# Patient Record
Sex: Female | Born: 1935 | Race: White | Hispanic: No | Smoking: Never smoker
Health system: Southern US, Community
[De-identification: ages and names within clinical notes are randomized; demographics above are authoritative.]

## PROBLEM LIST (undated history)

## (undated) DIAGNOSIS — IMO0001 Reserved for inherently not codable concepts without codable children: Secondary | ICD-10-CM

## (undated) DIAGNOSIS — H269 Unspecified cataract: Secondary | ICD-10-CM

## (undated) DIAGNOSIS — I10 Essential (primary) hypertension: Secondary | ICD-10-CM

## (undated) DIAGNOSIS — F0280 Dementia in other diseases classified elsewhere without behavioral disturbance: Secondary | ICD-10-CM

## (undated) DIAGNOSIS — F0390 Unspecified dementia without behavioral disturbance: Secondary | ICD-10-CM

## (undated) DIAGNOSIS — G309 Alzheimer's disease, unspecified: Secondary | ICD-10-CM

## (undated) DIAGNOSIS — H9190 Unspecified hearing loss, unspecified ear: Secondary | ICD-10-CM

## (undated) HISTORY — PX: CATARACT EXTRACTION W/ INTRAOCULAR LENS IMPLANT: SHX1309

---

## 2008-08-04 ENCOUNTER — Ambulatory Visit: Payer: Self-pay | Admitting: Diagnostic Radiology

## 2008-08-04 ENCOUNTER — Encounter: Payer: Self-pay | Admitting: Emergency Medicine

## 2008-08-05 ENCOUNTER — Inpatient Hospital Stay (HOSPITAL_COMMUNITY): Admission: EM | Admit: 2008-08-05 | Discharge: 2008-08-08 | Payer: Self-pay | Admitting: *Deleted

## 2009-12-18 ENCOUNTER — Emergency Department (HOSPITAL_BASED_OUTPATIENT_CLINIC_OR_DEPARTMENT_OTHER): Admission: EM | Admit: 2009-12-18 | Discharge: 2009-12-18 | Payer: Self-pay | Admitting: Emergency Medicine

## 2009-12-18 ENCOUNTER — Ambulatory Visit: Payer: Self-pay | Admitting: Diagnostic Radiology

## 2010-09-09 LAB — BASIC METABOLIC PANEL
BUN: 8 mg/dL (ref 6–23)
CO2: 26 mEq/L (ref 19–32)
CO2: 29 mEq/L (ref 19–32)
Calcium: 9 mg/dL (ref 8.4–10.5)
Chloride: 110 mEq/L (ref 96–112)
Chloride: 111 mEq/L (ref 96–112)
Creatinine, Ser: 1 mg/dL (ref 0.4–1.2)
Creatinine, Ser: 1.1 mg/dL (ref 0.4–1.2)
GFR calc Af Amer: 56 mL/min — ABNORMAL LOW (ref >60)
GFR calc Af Amer: >60 mL/min (ref >60)
GFR calc non Af Amer: 52 mL/min — ABNORMAL LOW (ref >60)
Glucose, Bld: 93 mg/dL (ref 70–99)
Potassium: 3.3 mEq/L — ABNORMAL LOW (ref 3.5–5.1)
Potassium: 3.4 mEq/L — ABNORMAL LOW (ref 3.5–5.1)
Sodium: 141 mEq/L (ref 135–145)

## 2010-09-09 LAB — URINALYSIS, ROUTINE W REFLEX MICROSCOPIC
Bilirubin Urine: NEGATIVE
Glucose, UA: NEGATIVE mg/dL
Leukocytes, UA: NEGATIVE
Leukocytes, UA: NEGATIVE
Nitrite: NEGATIVE
Protein, ur: NEGATIVE mg/dL
Protein, ur: NEGATIVE mg/dL
Specific Gravity, Urine: 1.015 (ref 1.005–1.030)
Urobilinogen, UA: 1 mg/dL (ref 0.0–1.0)
Urobilinogen, UA: 1 mg/dL (ref 0.0–1.0)
pH: 8 (ref 5.0–8.0)

## 2010-09-09 LAB — CULTURE, BLOOD (ROUTINE X 2)

## 2010-09-09 LAB — CBC
HCT: 33.6 % — ABNORMAL LOW (ref 36.0–46.0)
HCT: 33.9 % — ABNORMAL LOW (ref 36.0–46.0)
Hemoglobin: 11.4 g/dL — ABNORMAL LOW (ref 12.0–15.0)
Hemoglobin: 11.5 g/dL — ABNORMAL LOW (ref 12.0–15.0)
MCHC: 33.9 g/dL (ref 30.0–36.0)
Platelets: 238 10*3/uL (ref 150–400)
RBC: 3.89 MIL/uL (ref 3.87–5.11)
RDW: 13 % (ref 11.5–15.5)
WBC: 7.9 10*3/uL (ref 4.0–10.5)

## 2010-09-09 LAB — DIFFERENTIAL
Basophils Absolute: 0 10*3/uL (ref 0.0–0.1)
Basophils Absolute: 0.1 10*3/uL (ref 0.0–0.1)
Basophils Relative: 0 % (ref 0–1)
Eosinophils Absolute: 0 10*3/uL (ref 0.0–0.7)
Eosinophils Relative: 0 % (ref 0–5)
Lymphs Abs: 0.7 10*3/uL (ref 0.7–4.0)
Monocytes Absolute: 0.8 10*3/uL (ref 0.1–1.0)
Monocytes Relative: 8 % (ref 3–12)

## 2010-09-09 LAB — CULTURE, RESPIRATORY W GRAM STAIN

## 2010-09-09 LAB — PHOSPHORUS: Phosphorus: 3.5 mg/dL (ref 2.3–4.6)

## 2010-09-09 LAB — COMPREHENSIVE METABOLIC PANEL
Albumin: 3.8 g/dL (ref 3.5–5.2)
Alkaline Phosphatase: 66 U/L (ref 39–117)
Calcium: 9.4 mg/dL (ref 8.4–10.5)
Chloride: 104 mEq/L (ref 96–112)
GFR calc non Af Amer: 52 mL/min — ABNORMAL LOW (ref >60)
Glucose, Bld: 114 mg/dL — ABNORMAL HIGH (ref 70–99)
Potassium: 4.1 mEq/L (ref 3.5–5.1)

## 2010-09-09 LAB — MAGNESIUM: Magnesium: 2.2 mg/dL (ref 1.5–2.5)

## 2010-09-09 LAB — BRAIN NATRIURETIC PEPTIDE: Pro B Natriuretic peptide (BNP): 72.1 pg/mL (ref 0.0–100.0)

## 2010-09-09 LAB — URINE MICROSCOPIC-ADD ON

## 2010-09-09 LAB — EXPECTORATED SPUTUM ASSESSMENT W GRAM STAIN, RFLX TO RESP C

## 2010-09-09 LAB — PROTIME-INR: Prothrombin Time: 16 seconds — ABNORMAL HIGH (ref 11.6–15.2)

## 2010-10-12 NOTE — Discharge Summary (Signed)
NAMESONJI, Sophia Montgomery             ACCOUNT NO.:  [id number]   MEDICAL RECORD NO.:  [id number]          PATIENT TYPE:  INP   LOCATION:  1413                         FACILITY:  [facility]   PHYSICIAN:  Hillery Aldo, M.D.   DATE OF BIRTH:  12-22-1914   DATE OF ADMISSION:  08/05/2008  DATE OF DISCHARGE:  08/08/2008                               DISCHARGE SUMMARY   PRIMARY CARE PHYSICIAN:  Sophia Montgomery, M.D. in [place].   DISCHARGE DIAGNOSES:  1. Bibasilar pneumonia.  2. Gastroesophageal reflux disease with possible esophageal      dysmotility.  3. Dementia.  4. Hypertension.  5. Hypokalemia.  6. Mild normocytic anemia.  7. Chronic thoracic compression fractures.  8. Acute sinusitis.  9. Stage 3 chronic kidney disease.   DISCHARGE MEDICATIONS:  1. Aricept 5 mg p.o. daily.  2. Aspirin 81 mg p.o. daily.  3. Docusate 100 mg p.o. daily.  4. Furosemide 20 mg p.o. daily.  5. MiraLax 17 g p.o. daily.  6. Lotemax 0.5% one drop in each eye b.i.d.  7. Refresh eye drops 1 drop in each eye b.i.d.  8. GenTeal lubricant 0.3% one drop in each eye q.i.d.  9. Diazepam 5 mg p.o. bedtime.  10.GenTeal Severe Sterile 0.3% to both eyes at bedtime.  11.Lacri-Lube SOP ointment to both eyes bedtime.  12.Hydrocodone with acetaminophen 5/500 one tablet q.6 h p.r.n. pain.  13.Ultracet 37.5/325 one tablet t.i.d. p.r.n. pain.  14.Milk of magnesia 400 mg per 5 mL daily p.r.n. constipation, take 30      mL by mouth.  15.Hycodan syrup 5 mL q.6 h p.r.n. cough.  16.Norvasc 10 mg p.o. daily.  17.Azithromycin 500 mg p.o. daily x2 more days.  18.Avelox 400 mg daily x4 more days.  19.Potassium chloride 10 mEq p.o. b.i.d.   CONSULTATIONS:  None.   BRIEF ADMISSION HISTORY OF PRESENT ILLNESS:  The patient is a 76-year-  old female who sustained a fall in her skilled nursing facility and was  found on the floor with a fever of 103.1.  She subsequently was brought  to the hospital for further  evaluation and treatment.  For full details,  please see the dictated report done by Dr. Allena Katz.   PROCEDURES AND DIAGNOSTIC STUDIES:  1. Chest x-ray on August 04, 2008 showed bibasilar infiltrates and      thoracic compression fractures.  2. CT scan of the head on August 04, 2008 showed no acute traumatic      injury.  Advanced ischemic sequelae in the brain.  No acute      intracranial abnormality.  Diffuse paranasal sinus inflammatory      changes suggestive of acute sinusitis.  3. Chest x-ray on July 08, 2008 showed improved bibasilar      pneumonia.  Small bilateral effusions have increased.  4. Swallowing evaluation on August 06, 2008 showed no evidence of      aspiration or penetration with any consistency.  Mild vallecular      residual with pudding was cleared with liquid consumption.      Possible esophageal stasis and backflow of thin liquids with  possible esophageal dysmotility.  Consider outpatient esophagram      for empiric treatment.   DISCHARGE LABORATORY VALUES:  Respiratory cultures grew normal oral  pharyngeal flora.  BNP was 72.1.  Sodium was 143, potassium 3.5,  chloride 108, bicarb 29, BUN 8, creatinine 1.10, glucose 96.  White  blood cell count was 7.9, hemoglobin 12.3, hematocrit 36.3, platelets  239.   HOSPITAL COURSE BY PROBLEM:  1. Weakness secondary to bibasilar pneumonia:  The patient was      admitted and sputum cultures were obtained which did not grow any      pathogenic bacteria.  The patient is currently on day 4 of      antibiotics, currently Avelox and azithromycin.  She will complete      a total course of 5 days of therapy with azithromycin and 7 days of      therapy with Avelox.  Because of concerns for aspiration,      swallowing evaluation was performed which did not show any evidence      of aspiration.  She was put on reflux precautions, given the      possible diagnosis of esophageal dysmotility.  If concerns persist,      an  esophagram can be performed as an outpatient.  2. Gastroesophageal reflux disease:  Again, the patient possibly has      esophageal dysmotility.  Outpatient esophagram can be performed if      deemed necessary by her primary care physician.  3. Dementia:  The patient was maintained on Aricept.  4. Hypertension:  The patient was started on Norvasc and this was      titrated up to 10 mg.  She should have outpatient followup of her      blood pressure to ensure that she is controlled..  5. Hypokalemia:  The patient was repleted and put on routine      supplementation, given that she is on Lasix regularly.  6. Mild normocytic anemia:  The patient's anemia resolved at      discharge.  7. Chronic thoracic compression fractures:  No acute treatment      indicated.  The patient will likely need to be considered for      osteoporosis therapy as an outpatient.  With her esophageal      dysmotility, she likely is not a bisphosphonate candidate but may      benefit from calcitonin.  She should have an outpatient bone      density scan done if not done already, and consideration for      initiation of empiric therapy with calcitonin and calcium      supplements.  8. Stage 3 chronic kidney disease:  The patient's creatinine clearance      is consistent with a diagnosis of stage 3 chronic kidney disease.      Given this, we are controlling her blood pressure to help prevent      any further deterioration in her renal function.   DISPOSITION:  The patient is medically stable and can be returned to  [facility].  She should receive PT and OT  services while there until she is at her baseline level of functioning.  She should follow up with primary care physician next week.  She should  be maintained on a dysphagia 2 with thin liquid diet.   Time spent coordinating care for discharge and discharge instructions  equals 35 minutes.      Hillery Aldo, M.D.  Electronically  Signed     CR/MEDQ  D:  08/08/2008  T:  08/08/2008  Job:  [id number]   cc:   Sophia Montgomery  Fax: [phone]

## 2010-10-12 NOTE — H&P (Signed)
Sophia Montgomery, Sophia Montgomery             ACCOUNT NO.:  [id number]   MEDICAL RECORD NO.:  [id number]          PATIENT TYPE:  INP   LOCATION:  1413                         FACILITY:  [facility] [place]   PHYSICIAN:  Manus Gunning, MD      DATE OF BIRTH:  [birth year]   DATE OF ADMISSION:  08/05/2008  DATE OF DISCHARGE:                              HISTORY & PHYSICAL   CHIEF COMPLAINT:  Sustained fall, unwitnessed with chronic cough and  pneumonia on chest x-ray.   HISTORY OF PRESENT ILLNESS:  Sophia Montgomery is a 77 year old Caucasian  female resident of a skilled nursing facility.  Apparently she was found  on the floor in her room and was brought to [facility]. At  the time of presentation, she had a temperature of 103.1.  This prompted  work up for an infective process including a UA, which was negative, a  chest x-ray which demonstrated bibasilar infiltrates.  She has had a  cough at the skilled nursing facility, the duration of which is unknown,  secondary to the fact that the patient has moderate dementia and is very  hard of hearing and can provide very little to no history at all.  According to the ED sign out, over the phone, the patient had an  unwitnessed fall and was just found lying on the floor.  A CT scan of  the head did not demonstrate any acute intracranial abnormality.  The  patient, herself, upon interview when she presented to our facility  claims that she does not have a cough, though we can actively hear  rhonchi upon entering the room.   PAST MEDICAL HISTORY:  1. Dementia.  2. Syncope.  3. Rhabdomyolysis.   PAST SURGICAL HISTORY:  Unknown.   SOCIAL HISTORY:  Nonsmoker, nondrinker, no drug abuse.  Lives at a  skilled nursing facility.   HOME MEDICATIONS:  1. Aricept 5 mg p.o. daily.  2. Aspirin 81 mg daily.  3. Docusate 100 mg daily.  4. Furosemide 20 mg daily.  5. Spironolactone 2 grams powder daily.  6. Lotemax 0.5%, one drop both eyes b.i.d.  7. Refresh 1  drop both eyes b.i.d.  8. GenTeal lubricant 0.3%, one drop q.i.d.  9. Diazepam 5 mg nightly.  10.GenTeal Severe Sterile 0.3% to eyes q.h.s.  11.Lacri-Lube S.O.P. ointment GM to eyes q.h.s.  12.Hycodan syrup 5 mL every 6 hours as needed p.r.n.  13.Hydrocodone with acetaminophen 5/500 every 6 hours as needed.  14.Milk of Magnesia 400 mg per 5 mL, take 30 mL by mouth daily for      constipation p.r.n.  15.Tramadol HCl/Acetaminophen (Ultracet), take every 4 hours as needed      for shoulder pain.   FAMILY HISTORY:  Unable to be obtained.   ALLERGIES:  No known drug allergies.   REVIEW OF SYSTEMS:  Essentially a 14 point review of systems has been  performed, unfortunately unable to obtain majority, essentially none at  all secondary to the patient being hard of hearing and with moderate  dementia.   PHYSICAL EXAMINATION:  VITAL SIGNS:  At the time of  presentation as  follows: Temperature 103.1, pulse 108, respiratory rate 20, blood  pressure 174/86, improved to 124/56.  Oxygen saturation 91% on room air.  GENERAL:  Frail, elderly lady lying in bed, comfortably sleeping, no  apparent distress.  HEENT:  Normocephalic, atraumatic.  Dry oral mucosa.  No postnasal drip.  Eyes anicteric.  Extraocular muscles intact.  Pupils equal and react to  light and accommodation.  NECK:  Supple.  Good range of motion.  No thyromegaly.  Neck veins flat.  CARDIOVASCULAR:  S1, S2 normal.  Regular rate and rhythm.  No murmurs,  rubs or gallops.  RESPIRATORY:  Coarse breath sounds diffusely across the chest along the  upper respiratory tract with rhonchi in upper respiratory tract and  lower, air entry appears to be bilaterally equal.  ABDOMEN:  Soft, nontender, nondistended.  Positive bowel sounds.  No  organomegaly.  EXTREMITIES:  No cyanosis, no clubbing, no edema, positive bilateral  dorsalis pedis pulses.  CNS:  Patient is asleep, able to be aroused. Patient does not know where  she is and is  confused about the year as well though she does attempt to  answer questions, is very hard of hearing.  Moves all four extremities  independently.  Cranial nerves II-XII grossly intact.  SKIN:  No rashes, ulcerations, no masses, no edema, no palpable  lymphadenopathy.  No bruising or petechiae appreciated.   LABORATORY DATA:  Urinalysis negative nitrate, negative leukocyte  esterase, epithelial cells rare.  Urine WBCs and RBCs 0-2 each.  Bacteria rare.  WBC count 13,500, hemoglobin 12.9, hematocrit 38.1,  platelet count 238,000.  Polymorphs 88%.  Sodium 141, potassium 4.1,  chloride 104, carbon dioxide 29, glucose 114, BUN 20, creatinine 1.0.  Total protein 7.3, albumin 3.8, AST 34, ALT 16.  Chest x-ray  demonstrates bibasilar infiltrates with thoracic compression fractures.  CT of the head without contrast demonstrating calcified atherosclerosis  with sinusitis with cortical and subcortical encephalomalacia diffusely.  No evidence of acute cortically based infarct identified.   ASSESSMENT AND PLAN:  1. Pneumonia.  Start normal saline 125 mL an hour.  Repeat chest x-ray      PA and lateral in the morning.  Obtain sputum culture, sensitivity.      Also obtain blood cultures x2.  The patient is a resident of a      skilled nursing facility, start on vancomycin and Zosyn at this      time.  Start O2 by nasal cannula and keep oxygen saturation greater      than 92%.  2. Dementia.  Continue all home medications and monitor and treat      supportively.  3. Gastrointestinal and deep venous thrombosis prophylaxis, Protonix      40 mg p.o. daily with systemic compressive devices.      Manus Gunning, MD  Electronically Signed     SP/MEDQ  D:  08/05/2008  T:  08/05/2008  Job:  [id number]

## 2011-02-13 ENCOUNTER — Encounter: Payer: Self-pay | Admitting: Emergency Medicine

## 2011-02-13 ENCOUNTER — Emergency Department (HOSPITAL_BASED_OUTPATIENT_CLINIC_OR_DEPARTMENT_OTHER)
Admission: EM | Admit: 2011-02-13 | Discharge: 2011-02-13 | Disposition: A | Discharge status: Home / Self Care | Payer: Medicare Other | Attending: Emergency Medicine | Admitting: Emergency Medicine

## 2011-02-13 DIAGNOSIS — R609 Edema, unspecified: Secondary | ICD-10-CM | POA: Insufficient documentation

## 2011-02-13 DIAGNOSIS — I10 Essential (primary) hypertension: Secondary | ICD-10-CM | POA: Insufficient documentation

## 2011-02-13 DIAGNOSIS — Z79899 Other long term (current) drug therapy: Secondary | ICD-10-CM | POA: Insufficient documentation

## 2011-02-13 DIAGNOSIS — R600 Localized edema: Secondary | ICD-10-CM

## 2011-02-13 DIAGNOSIS — G309 Alzheimer's disease, unspecified: Secondary | ICD-10-CM | POA: Insufficient documentation

## 2011-02-13 DIAGNOSIS — F0280 Dementia in other diseases classified elsewhere without behavioral disturbance: Secondary | ICD-10-CM | POA: Insufficient documentation

## 2011-02-13 HISTORY — DX: Unspecified dementia, unspecified severity, without behavioral disturbance, psychotic disturbance, mood disturbance, and anxiety: F03.90

## 2011-02-13 HISTORY — DX: Essential (primary) hypertension: I10

## 2011-02-13 HISTORY — DX: Alzheimer's disease, unspecified: G30.9

## 2011-02-13 HISTORY — DX: Unspecified hearing loss, unspecified ear: H91.90

## 2011-02-13 HISTORY — DX: Unspecified cataract: H26.9

## 2011-02-13 HISTORY — DX: Reserved for inherently not codable concepts without codable children: IMO0001

## 2011-02-13 HISTORY — DX: Dementia in other diseases classified elsewhere, unspecified severity, without behavioral disturbance, psychotic disturbance, mood disturbance, and anxiety: F02.80

## 2011-02-13 NOTE — ED Notes (Signed)
Pt presented with swelling to L ankle pulses intact non pitting edema

## 2011-02-13 NOTE — ED Provider Notes (Signed)
History     CSN: [id number] Arrival date & time: 02/13/2011  4:51 PM   Chief Complaint  Patient presents with  . Joint Swelling    L ankle swelling per care home     (Include location/radiation/quality/duration/timing/severity/associated sxs/prior treatment) HPI Comments: Nursing home reports that they noticed some redness and swelling to the left leg that was noticed by the staff today:staff denies falls  The history is provided by the nursing home and the EMS personnel. History Limited By: pt has alzheimers. No language interpreter was used.     Past Medical History  Diagnosis Date  . Dementia   . Hypertension   . Cataracts, both eyes   . Alzheimer's dementia   . Impaired hearing      Past Surgical History  Procedure Date  . Cataract extraction w/ intraocular lens implant     No family history on file.  History  Substance Use Topics  . Smoking status: Never Smoker   . Smokeless tobacco: Not on file  . Alcohol Use: No    OB History    Grav Para Term Preterm Abortions TAB SAB Ect Mult Living                  Review of Systems  Unable to perform ROS: Dementia  Respiratory: Negative.   Skin:       C/o redness to the lower leg/ankle    Allergies  Review of patient's allergies indicates no known allergies.  Home Medications   Current Outpatient Rx  Name Route Sig Dispense Refill  . AMLODIPINE BESYLATE 10 MG PO TABS Oral Take 10 mg by mouth daily.      Marland Kitchen GENTEAL OP GEL Both Eyes Place 1 drop into both eyes daily.      Marland Kitchen LACRI-LUBE S.O.P. OP Both Eyes Place into both eyes at bedtime.      . ASPIRIN EC 81 MG PO TBEC Oral Take 81 mg by mouth daily.      Marland Kitchen DIAZEPAM 5 MG PO TABS Oral Take 5 mg by mouth at bedtime.      Marland Kitchen DOCUSATE SODIUM 50 MG/5ML PO LIQD Oral Take 100 mg by mouth daily.      . DONEPEZIL HCL 5 MG PO TABS Oral Take 5 mg by mouth at bedtime.      . FUROSEMIDE 20 MG PO TABS Oral Take 20 mg by mouth daily.      Marland Kitchen LOTEPREDNOL ETABONATE 0.5 % OP  SUSP Both Eyes Place 1 drop into both eyes daily.      Marland Kitchen POLYETHYLENE GLYCOL 3350 PO POWD Oral Take 17 g by mouth daily.      Marland Kitchen POTASSIUM CHLORIDE 10 MEQ PO TBCR Oral Take 10 mEq by mouth 2 (two) times daily.      . ACETAMINOPHEN 325 MG PO TABS Oral Take 325 mg by mouth every 4 (four) hours as needed. pain     . GENTEAL OP GEL Both Eyes Place 1 drop into both eyes 2 (two) times daily as needed. Dry eyes     . DEXTROMETHORPHAN POLISTIREX 30 MG/5ML PO LQCR Oral Take 30 mg by mouth every 12 (twelve) hours as needed. For cough     . MAGNESIUM HYDROXIDE 400 MG/5ML PO SUSP Oral Take 30 mLs by mouth daily as needed. Constipation       Physical Exam    BP 145/75  Pulse 77  Temp(Src) 98 F (36.7 C) (Oral)  Resp 18  SpO2 97%  Physical  Exam  Nursing note and vitals reviewed. Constitutional: She appears well-developed and well-nourished.  Cardiovascular: Normal rate and regular rhythm.   Pulmonary/Chest: Effort normal and breath sounds normal.  Musculoskeletal:       Pt has bilateral non pitting edema to ankles  Skin:       No redness noted to leg    ED Course  Procedures   No results found.   No diagnosis found.   MDM No redness noted on exam:pt has bilateral non pitting edema, which doesn't seem like a new finding       Teressa Lower, NP 02/13/11 1853

## 2011-02-13 NOTE — ED Notes (Signed)
The patient is undress and in a gown. The bed is locked and in the lowest position. The call light is within reach. The patient is resting. Warm blankets given.

## 2011-02-13 NOTE — ED Notes (Signed)
Patient found climbing out of bed. On arrival to room, she stated she had to pee. With help from [place], EMT we got her on the bedpan and she peed and had small bowel movement. Patient cleaned and new brief placed on patient.

## 2011-02-15 NOTE — ED Provider Notes (Signed)
History/physical exam/procedure(s) were performed by non-physician practitioner and as supervising physician I was immediately available for consultation/collaboration. I have reviewed all notes and am in agreement with care and plan.   Hilario Quarry, MD 02/15/11 1228

## 2012-11-09 ENCOUNTER — Other Ambulatory Visit (HOSPITAL_COMMUNITY): Payer: Self-pay | Admitting: Geriatric Medicine

## 2012-11-09 DIAGNOSIS — R1310 Dysphagia, unspecified: Secondary | ICD-10-CM

## 2012-11-22 ENCOUNTER — Ambulatory Visit (HOSPITAL_COMMUNITY)
Admission: RE | Admit: 2012-11-22 | Discharge: 2012-11-22 | Disposition: A | Discharge status: Home / Self Care | Payer: Medicare Other | Source: Ambulatory Visit | Attending: Geriatric Medicine | Admitting: Geriatric Medicine

## 2012-11-22 DIAGNOSIS — H9190 Unspecified hearing loss, unspecified ear: Secondary | ICD-10-CM | POA: Insufficient documentation

## 2012-11-22 DIAGNOSIS — R634 Abnormal weight loss: Secondary | ICD-10-CM | POA: Insufficient documentation

## 2012-11-22 DIAGNOSIS — R1311 Dysphagia, oral phase: Secondary | ICD-10-CM | POA: Insufficient documentation

## 2012-11-22 DIAGNOSIS — R1310 Dysphagia, unspecified: Secondary | ICD-10-CM | POA: Insufficient documentation

## 2012-11-22 NOTE — Procedures (Addendum)
Objective Swallowing Evaluation: Modified Barium Swallowing Study  Patient Details  Name: Sophia Montgomery MRN: [id number] Date of Birth: 09-01-1914  Today's Date: 11/22/2012 Time: 1210-1235 SLP Time Calculation (min): 25 min  Past Medical History: No past medical history on file. Past Surgical History: No past surgical history on file. HPI:  Pt is a 79 year old resident of SNF, arriving for an OP MBS due to concern that she may be aspirating. Pt has been observed to have wet vocal quality and unexplained weight loss. She currently consume Dys 1/thin liquids. May soon upgrade to Dys 2 due to new dentures. Pt is extremely hard of hearing but follows comamnds iwth visual/gestural cues.      Assessment / Plan / Recommendation Clinical Impression  Dysphagia Diagnosis: Mild oral phase dysphagia Clinical impression: Pt presents with a mild oral phase dysphagia including prolonged lingual pumping and holding of bolus. Initiation of swallow response delayed, with liquids falling to pyriforms prior to airway protection. Despite large consecutive sips the pt did not ever penetrate or aspirate during study. Esophageal sweep appeared WFL. Pt may continue thin liquids, will defer management of diet to primary SLP. Puree or ground solids are safe.     Treatment Recommendation  Defer treatment plan to SLP at (Comment)    Diet Recommendation Dysphagia 1 (Puree);Thin liquid   Liquid Administration via: Cup;Straw Medication Administration: Crushed with puree Supervision: Patient able to self feed Compensations: Slow rate;Small sips/bites Postural Changes and/or Swallow Maneuvers: Seated upright 90 degrees    Other  Recommendations Oral Care Recommendations: Oral care BID   Follow Up Recommendations  Assisted living    Frequency and Duration        Pertinent Vitals/Pain NA    SLP Swallow Goals     General HPI: Pt is a 79 year old resident of SNF, arriving for an OP MBS due to concern that she  may be aspirating. Pt has been observed to have wet vocal quality and unexplained weight loss. She currently consume Dys 1/thin liquids. May soon upgrade to Dys 2 due to new dentures. Pt is extremely hard of hearing but follows comamnds iwth visual/gestural cues.  Type of Study: Modified Barium Swallowing Study Reason for Referral: Objectively evaluate swallowing function Diet Prior to this Study: Dysphagia 1 (puree);Thin liquids Temperature Spikes Noted: N/A Respiratory Status: Room air History of Recent Intubation: No Behavior/Cognition: Alert;Cooperative Oral Cavity - Dentition: Edentulous Oral Motor / Sensory Function: Within functional limits Self-Feeding Abilities: Able to feed self Patient Positioning: Upright in chair Baseline Vocal Quality: Clear Volitional Cough: Weak Volitional Swallow: Able to elicit Anatomy: Within functional limits (cervical lordosis) Pharyngeal Secretions: Not observed secondary MBS    Reason for Referral Objectively evaluate swallowing function   Oral Phase Oral Preparation/Oral Phase Oral Phase: Impaired Oral - Thin Oral - Thin Cup: Lingual pumping;Holding of bolus;Delayed oral transit Oral - Thin Straw: Lingual pumping;Holding of bolus;Delayed oral transit Oral - Solids Oral - Puree: Lingual pumping;Holding of bolus;Delayed oral transit Oral - Mechanical Soft: Lingual pumping;Holding of bolus;Delayed oral transit;Lingual/palatal residue Oral - Pill: Pocketing in anterior sulcus;Delayed oral transit;Holding of bolus (pt could not transit with liquids or puree)   Pharyngeal Phase Pharyngeal Phase Pharyngeal Phase: Impaired Pharyngeal - Thin Pharyngeal - Thin Cup: Delayed swallow initiation;Premature spillage to pyriform sinuses Pharyngeal - Thin Straw: Delayed swallow initiation;Premature spillage to pyriform sinuses Pharyngeal - Solids Pharyngeal - Puree: Delayed swallow initiation;Premature spillage to pyriform sinuses Pharyngeal - Mechanical  Soft: Delayed swallow initiation;Premature spillage to pyriform sinuses  Cervical Esophageal Phase    GO    Cervical Esophageal Phase Cervical Esophageal Phase: [facility]    Functional Assessment Tool Used: clinical judgement Functional Limitations: Swallowing Swallow Current Status ([id number]): At least 1 percent but less than 20 percent impaired, limited or restricted Swallow Goal Status [phone]): At least 1 percent but less than 20 percent impaired, limited or restricted Swallow Discharge Status [phone]): At least 1 percent but less than 20 percent impaired, limited or restricted   [facility], [place] CCC-SLP [phone]  Claudine Mouton 11/22/2012, 2:26 PM

## 2013-06-23 ENCOUNTER — Emergency Department (HOSPITAL_COMMUNITY): Payer: Medicare Other

## 2013-06-23 ENCOUNTER — Encounter (HOSPITAL_COMMUNITY): Payer: Self-pay | Admitting: Emergency Medicine

## 2013-06-23 ENCOUNTER — Inpatient Hospital Stay (HOSPITAL_COMMUNITY)
Admission: EM | Admit: 2013-06-23 | Discharge: 2013-06-28 | DRG: 193 | Disposition: A | Discharge status: Hospice | Payer: Medicare Other | Attending: Internal Medicine | Admitting: Internal Medicine

## 2013-06-23 DIAGNOSIS — F0280 Dementia in other diseases classified elsewhere without behavioral disturbance: Secondary | ICD-10-CM | POA: Diagnosis present

## 2013-06-23 DIAGNOSIS — J09X2 Influenza due to identified novel influenza A virus with other respiratory manifestations: Principal | ICD-10-CM | POA: Diagnosis present

## 2013-06-23 DIAGNOSIS — R7989 Other specified abnormal findings of blood chemistry: Secondary | ICD-10-CM | POA: Diagnosis present

## 2013-06-23 DIAGNOSIS — Z7982 Long term (current) use of aspirin: Secondary | ICD-10-CM

## 2013-06-23 DIAGNOSIS — E41 Nutritional marasmus: Secondary | ICD-10-CM | POA: Diagnosis present

## 2013-06-23 DIAGNOSIS — R6889 Other general symptoms and signs: Secondary | ICD-10-CM

## 2013-06-23 DIAGNOSIS — Z79899 Other long term (current) drug therapy: Secondary | ICD-10-CM

## 2013-06-23 DIAGNOSIS — Z681 Body mass index (BMI) 19 or less, adult: Secondary | ICD-10-CM

## 2013-06-23 DIAGNOSIS — R0902 Hypoxemia: Secondary | ICD-10-CM | POA: Diagnosis present

## 2013-06-23 DIAGNOSIS — Z515 Encounter for palliative care: Secondary | ICD-10-CM

## 2013-06-23 DIAGNOSIS — G309 Alzheimer's disease, unspecified: Secondary | ICD-10-CM | POA: Diagnosis present

## 2013-06-23 DIAGNOSIS — I10 Essential (primary) hypertension: Secondary | ICD-10-CM | POA: Diagnosis present

## 2013-06-23 DIAGNOSIS — N179 Acute kidney failure, unspecified: Secondary | ICD-10-CM | POA: Diagnosis present

## 2013-06-23 DIAGNOSIS — J209 Acute bronchitis, unspecified: Secondary | ICD-10-CM | POA: Diagnosis present

## 2013-06-23 DIAGNOSIS — J9600 Acute respiratory failure, unspecified whether with hypoxia or hypercapnia: Secondary | ICD-10-CM | POA: Diagnosis present

## 2013-06-23 DIAGNOSIS — Z66 Do not resuscitate: Secondary | ICD-10-CM | POA: Diagnosis present

## 2013-06-23 DIAGNOSIS — E860 Dehydration: Secondary | ICD-10-CM | POA: Diagnosis present

## 2013-06-23 DIAGNOSIS — E039 Hypothyroidism, unspecified: Secondary | ICD-10-CM | POA: Diagnosis present

## 2013-06-23 LAB — BASIC METABOLIC PANEL
BUN: 22 mg/dL (ref 6–23)
CHLORIDE: 102 meq/L (ref 96–112)
CO2: 25 mEq/L (ref 19–32)
Calcium: 9 mg/dL (ref 8.4–10.5)
Creatinine, Ser: 1.38 mg/dL — ABNORMAL HIGH (ref 0.50–1.10)
GFR calc non Af Amer: 31 mL/min — ABNORMAL LOW (ref >90)
GFR, EST AFRICAN AMERICAN: 36 mL/min — AB (ref >90)
Glucose, Bld: 122 mg/dL — ABNORMAL HIGH (ref 70–99)
POTASSIUM: 4.2 meq/L (ref 3.7–5.3)
Sodium: 140 mEq/L (ref 137–147)

## 2013-06-23 LAB — CBC
HCT: 35.6 % — ABNORMAL LOW (ref 36.0–46.0)
Hemoglobin: 12.1 g/dL (ref 12.0–15.0)
MCH: 31.7 pg (ref 26.0–34.0)
MCHC: 34 g/dL (ref 30.0–36.0)
MCV: 93.2 fL (ref 78.0–100.0)
PLATELETS: 156 10*3/uL (ref 150–400)
RBC: 3.82 MIL/uL — AB (ref 3.87–5.11)
RDW: 13.3 % (ref 11.5–15.5)
WBC: 8.1 10*3/uL (ref 4.0–10.5)

## 2013-06-23 LAB — URINALYSIS, ROUTINE W REFLEX MICROSCOPIC
Bilirubin Urine: NEGATIVE
GLUCOSE, UA: NEGATIVE mg/dL
KETONES UR: NEGATIVE mg/dL
LEUKOCYTES UA: NEGATIVE
Nitrite: NEGATIVE
PH: 5 (ref 5.0–8.0)
Protein, ur: NEGATIVE mg/dL
SPECIFIC GRAVITY, URINE: 1.02 (ref 1.005–1.030)
Urobilinogen, UA: 0.2 mg/dL (ref 0.0–1.0)

## 2013-06-23 LAB — URINE MICROSCOPIC-ADD ON

## 2013-06-23 LAB — CG4 I-STAT (LACTIC ACID): Lactic Acid, Venous: 1.31 mmol/L (ref 0.5–2.2)

## 2013-06-23 LAB — POCT I-STAT TROPONIN I: Troponin i, poc: 0.04 ng/mL (ref 0.00–0.08)

## 2013-06-23 LAB — MRSA PCR SCREENING: MRSA by PCR: NEGATIVE

## 2013-06-23 MED ORDER — ACETAMINOPHEN 500 MG PO TABS
1000.0000 mg | ORAL_TABLET | Freq: Once | ORAL | Status: DC
Start: 2013-06-23 — End: 2013-06-23
  Filled 2013-06-23: qty 2

## 2013-06-23 MED ORDER — ALBUTEROL SULFATE (2.5 MG/3ML) 0.083% IN NEBU
2.5000 mg | INHALATION_SOLUTION | Freq: Four times a day (QID) | RESPIRATORY_TRACT | Status: DC
  Administered 2013-06-24 (×4): 2.5 mg via RESPIRATORY_TRACT
  Filled 2013-06-23 (×4): qty 3

## 2013-06-23 MED ORDER — ARTIFICIAL TEARS OP OINT
TOPICAL_OINTMENT | Freq: Every day | OPHTHALMIC | Status: DC
  Administered 2013-06-23 – 2013-06-27 (×5): via OPHTHALMIC
  Filled 2013-06-23: qty 3.5

## 2013-06-23 MED ORDER — REFRESH LACRI-LUBE OP OINT: 1.0000 "application " | TOPICAL_OINTMENT | Freq: Every day | OPHTHALMIC | Status: DC

## 2013-06-23 MED ORDER — DOCUSATE SODIUM 50 MG/5ML PO LIQD
100.0000 mg | Freq: Every day | ORAL | Status: DC
  Administered 2013-06-25: 100 mg via ORAL
  Filled 2013-06-23 (×3): qty 10

## 2013-06-23 MED ORDER — SODIUM CHLORIDE 0.9 % IV SOLN
INTRAVENOUS | Status: DC
  Administered 2013-06-23: 23:00:00 via INTRAVENOUS

## 2013-06-23 MED ORDER — ACETAMINOPHEN 650 MG RE SUPP
650.0000 mg | Freq: Once | RECTAL | Status: AC
  Administered 2013-06-23: 650 mg via RECTAL
  Filled 2013-06-23: qty 1

## 2013-06-23 MED ORDER — ACETAMINOPHEN 325 MG PO TABS: 650.0000 mg | ORAL_TABLET | Freq: Four times a day (QID) | ORAL | Status: DC | PRN

## 2013-06-23 MED ORDER — LEVOFLOXACIN IN D5W 500 MG/100ML IV SOLN
500.0000 mg | INTRAVENOUS | Status: DC
  Administered 2013-06-23: 500 mg via INTRAVENOUS
  Filled 2013-06-23 (×2): qty 100

## 2013-06-23 MED ORDER — POLYETHYLENE GLYCOL 3350 17 G PO PACK
17.0000 g | PACK | Freq: Every day | ORAL | Status: DC
  Administered 2013-06-27 – 2013-06-28 (×2): 17 g via ORAL
  Filled 2013-06-23 (×5): qty 1

## 2013-06-23 MED ORDER — THERA M PLUS PO TABS: 1.0000 | ORAL_TABLET | Freq: Every day | ORAL | Status: DC

## 2013-06-23 MED ORDER — ACETAMINOPHEN 650 MG RE SUPP: 650.0000 mg | Freq: Four times a day (QID) | RECTAL | Status: DC | PRN

## 2013-06-23 MED ORDER — ONDANSETRON HCL 4 MG PO TABS: 4.0000 mg | ORAL_TABLET | Freq: Four times a day (QID) | ORAL | Status: DC | PRN

## 2013-06-23 MED ORDER — LEVOTHYROXINE SODIUM 25 MCG PO TABS
25.0000 ug | ORAL_TABLET | Freq: Every day | ORAL | Status: DC
  Administered 2013-06-24 – 2013-06-25 (×2): 25 ug via ORAL
  Filled 2013-06-23 (×4): qty 1

## 2013-06-23 MED ORDER — LOTEPREDNOL ETABONATE 0.5 % OP SUSP
1.0000 [drp] | OPHTHALMIC | Status: DC
  Administered 2013-06-24 – 2013-06-28 (×5): 1 [drp] via OPHTHALMIC
  Filled 2013-06-23: qty 5

## 2013-06-23 MED ORDER — HEPARIN SODIUM (PORCINE) 5000 UNIT/ML IJ SOLN
5000.0000 [IU] | Freq: Three times a day (TID) | INTRAMUSCULAR | Status: DC
  Administered 2013-06-23 – 2013-06-26 (×9): 5000 [IU] via SUBCUTANEOUS
  Filled 2013-06-23 (×11): qty 1

## 2013-06-23 MED ORDER — ALBUTEROL SULFATE (2.5 MG/3ML) 0.083% IN NEBU
2.5000 mg | INHALATION_SOLUTION | RESPIRATORY_TRACT | Status: DC | PRN
  Administered 2013-06-25: 2.5 mg via RESPIRATORY_TRACT

## 2013-06-23 MED ORDER — DONEPEZIL HCL 5 MG PO TABS
5.0000 mg | ORAL_TABLET | Freq: Every day | ORAL | Status: DC
  Administered 2013-06-23: 5 mg via ORAL
  Filled 2013-06-23 (×2): qty 1

## 2013-06-23 MED ORDER — ADULT MULTIVITAMIN W/MINERALS CH
1.0000 | ORAL_TABLET | Freq: Every day | ORAL | Status: DC
  Administered 2013-06-24 – 2013-06-25 (×2): 1 via ORAL
  Filled 2013-06-23 (×3): qty 1

## 2013-06-23 MED ORDER — ONDANSETRON HCL 4 MG/2ML IJ SOLN: 4.0000 mg | Freq: Four times a day (QID) | INTRAMUSCULAR | Status: DC | PRN

## 2013-06-23 MED ORDER — DM-GUAIFENESIN ER 30-600 MG PO TB12
1.0000 | ORAL_TABLET | Freq: Two times a day (BID) | ORAL | Status: DC
  Administered 2013-06-23 – 2013-06-24 (×3): 1 via ORAL
  Administered 2013-06-25: 10:00:00 via ORAL
  Administered 2013-06-25: 1 via ORAL
  Filled 2013-06-23 (×7): qty 1

## 2013-06-23 MED ORDER — ASPIRIN 81 MG PO CHEW
81.0000 mg | CHEWABLE_TABLET | Freq: Every day | ORAL | Status: DC
  Administered 2013-06-23 – 2013-06-28 (×5): 81 mg via ORAL
  Filled 2013-06-23 (×6): qty 1

## 2013-06-23 MED ORDER — OSELTAMIVIR PHOSPHATE 30 MG PO CAPS
30.0000 mg | ORAL_CAPSULE | Freq: Two times a day (BID) | ORAL | Status: DC
  Administered 2013-06-23 – 2013-06-24 (×2): 30 mg via ORAL
  Filled 2013-06-23 (×4): qty 1

## 2013-06-23 NOTE — Progress Notes (Signed)
Pt was transported from ED to 5W via stretcher, pt is alert to self only but is combatative. Pt skin is intact with a scratch to the felt nare. Pt placed on droplet precaution due to being ruled out for influenza. Pt has nasal cannula in place. Pt bed is in lowest position, with call bell in reach and bed alarm placed. Will continue to monitor.   Christorpher Hisaw J. Lendell CapriceSullivan RN

## 2013-06-23 NOTE — ED Notes (Signed)
To room via EMS.  Onset 2 days cough/congestion, onset today fever of 101.  Pt went to [facility] today for eval of hip and shoulder, EMS not sure which one, but these symptoms not evaluated.  Xrays today were negative.   Pt O2 sat 87% on room air, O2 @ 2L on and o2 sat increased to 94%.  EMS BP 131/68 HR 92.  Pt is alert, stated name was Sophia Montgomery, [name] reported to EMS that pt is nonverbal and has had malaise today, is normally spitting at people.  Rhonchi in upper lobes.

## 2013-06-23 NOTE — H&P (Signed)
[facility] History and Physical  Augustin CoupeLucille Halterman ZOX:[id number]:[id number] DOB: 06/19/1914 DOA: 06/23/2013  Referring physician: Dr. Gwendolyn GrantWalden PCP: Florentina JennyRIPP, HENRY, MD   Chief Complaint:  Fever with cough since one day  HPI:  80 year old female with severe dementia, hypertension, who was sent from the facility with 2 day history of cough with congestion with fever of 101F at the facility. Her oxygen saturation on room air was 87% and improved to 94% on 2 L via nasal cannula. Blood pressure per EMS was 131/68 mmHg with a heart rate of 92. At baseline the patient is nonverbal and severely demented. She was noted to be less active in the facility today. In the ED patient was found to have a temperature of 101.82F had normal heart rate, respiratory rate and O2 sat of 100% on 2 L via nasal cannula. A chest x-ray done was negative for any infiltrate. Patient on exam had severe rhonchi bilaterally. Blood work done showed creatinine of 1.38. Prior hospitalist consulted for admission for fever with possible flulike symptoms. Report of nausea, vomiting, loss of consciousness, seizure-like activity, dysuria, diarrhea, chills, shortness of breath or fall. No history of agitation. History very  limited due to patient being nonverbal and severe dementia.  Review of Systems:  As outlined in history of presenting illness   Past Medical History  Diagnosis Date  . Hypertension   . Dementia   . Alzheimer's dementia    History reviewed. No pertinent past surgical history. Social History:  reports that she has never smoked. She does not have any smokeless tobacco history on file. Her alcohol and drug histories are not on file.  No Known Allergies  No family history on file.  Prior to Admission medications   Medication Sig Start Date End Date Taking? Authorizing Provider  acetaminophen (TYLENOL) 325 MG tablet Take 325 mg by mouth every 4 (four) hours as needed for mild pain.   Yes Historical Provider, MD   Artificial Tear Ointment (REFRESH LACRI-LUBE) OINT Apply 1 application to eye at bedtime.   Yes Historical Provider, MD  aspirin 81 MG chewable tablet Chew 81 mg by mouth daily.   Yes Historical Provider, MD  docusate (COLACE) 50 MG/5ML liquid Take 100 mg by mouth daily.   Yes Historical Provider, MD  donepezil (ARICEPT) 5 MG tablet Take 5 mg by mouth at bedtime.   Yes Historical Provider, MD  furosemide (LASIX) 40 MG tablet Take 40 mg by mouth daily.   Yes Historical Provider, MD  levofloxacin (LEVAQUIN) 250 MG tablet Take 500 mg by mouth daily. X 10 days   Yes Historical Provider, MD  levothyroxine (SYNTHROID, LEVOTHROID) 25 MCG tablet Take 25 mcg by mouth daily before breakfast.   Yes Historical Provider, MD  lisinopril (PRINIVIL,ZESTRIL) 5 MG tablet Take 5 mg by mouth daily.   Yes Historical Provider, MD  loteprednol (LOTEMAX) 0.5 % ophthalmic suspension Place 1 drop into both eyes every morning.   Yes Historical Provider, MD  Multiple Vitamins-Minerals (MULTIVITAMINS THER. W/MINERALS) TABS tablet Take by mouth daily.   Yes Historical Provider, MD  polyethylene glycol (MIRALAX / GLYCOLAX) packet Take 17 g by mouth daily.   Yes Historical Provider, MD  potassium chloride 20 MEQ/15ML (10%) solution Take 7.5 mEq by mouth 2 (two) times daily.   Yes Historical Provider, MD  sulfamethoxazole-trimethoprim (BACTRIM DS) 800-160 MG per tablet Take 1 tablet by mouth 2 (two) times daily.   Yes Historical Provider, MD    Physical Exam:  Filed Vitals:   06/23/13  1930 06/23/13 2040 06/23/13 2045 06/23/13 2112  BP: 127/51 141/50 144/47   Pulse: 86 67 86   Temp:    100.5 F (38.1 C)  TempSrc:    Rectal  Resp:      SpO2: 96% 98% 100%     Constitutional: Vital signs reviewed.  Patient is an elderly thin built female lying in bed in no acute distress. Nonverbal HEENT: No pallor, moist oral mucosa, has congested cough during exam Chest: bilateral coarse breath sounds, scattered  rhonchi Cardiovascular: RRR, S1 normal, S2 normal, no MRG,  Abdominal: Soft. Non-tender, non-distended, bowel sounds are normal,   extremities: Warm, no edema CNS: Nonverbal, alert and moving all extremities  Labs on Admission:  Basic Metabolic Panel:  Recent Labs Lab 06/23/13 1830  NA 140  K 4.2  CL 102  CO2 25  GLUCOSE 122*  BUN 22  CREATININE 1.38*  CALCIUM 9.0   Liver Function Tests: No results found for this basename: AST, ALT, ALKPHOS, BILITOT, PROT, ALBUMIN,  in the last 168 hours No results found for this basename: LIPASE, AMYLASE,  in the last 168 hours No results found for this basename: AMMONIA,  in the last 168 hours CBC:  Recent Labs Lab 06/23/13 1830  WBC 8.1  HGB 12.1  HCT 35.6*  MCV 93.2  PLT 156   Cardiac Enzymes: No results found for this basename: CKTOTAL, CKMB, CKMBINDEX, TROPONINI,  in the last 168 hours BNP: No components found with this basename: POCBNP,  CBG: No results found for this basename: GLUCAP,  in the last 168 hours  Radiological Exams on Admission: Dg Chest 2 View  06/23/2013   CLINICAL DATA:  Fever and cough  EXAM: CHEST  2 VIEW  COMPARISON:  DG CHEST 2V dated 06/23/2013  FINDINGS: Chronic central bronchial wall thickening is noted with diffuse mild prominence of the interstitial markings but no focal pulmonary opacity. The aorta is unfolded and ectatic. Mild enlargement of the cardiomediastinal silhouette is reidentified. Central thoracic vertebral compression fracture deformity is stable. No pleural effusion.  IMPRESSION: Stable chronic findings as above.   Electronically Signed   By: Christiana Pellant M.D.   On: 06/23/2013 18:40      Assessment/Plan  Principal Problem:   Hypoxia with Flu-like symptoms Admit to MedSurg. Chest x-ray negative for any infiltrate . No leukocytosis. Symptoms possible for acute bronchitis versus influenza. Will check for PCR and blood culture -IntakeLevaquin.ContinueO2via nasal cannula and scheduled  nebs.Added Mucinex.  Active Problems:  ? Acute kidney injury Baseline of function not known. I will place her on gentle hydration and monitor. Hold Lasix and ACE inhibitor    Hypertension On Lasix and ACE inhibitor. Hold  in the setting of? AKI     Alzheimer's dementia Severe. Continue Aricept  Hypothyroidism Continue Synthroid.  DVT prophylaxis: Subcutaneous heparin Diet: N.p.o. for now. Will get swallow evaluation.  Code Status: DO NOT RESUSCITATE Family Communication: Called son and left a message Disposition Plan: Return to facility once improved  Eddie North [facility] Pager [phone]  If 7PM-7AM, please contact night-coverage [email or web address] Password [facility] 06/23/2013, 9:16 PM   Total time spent: 70 minutes

## 2013-06-23 NOTE — ED Provider Notes (Signed)
CSN: [id number]     Arrival date & time 06/23/13  1655 History   First MD Initiated Contact with Patient 06/23/13 1657     Chief Complaint  Patient presents with  . Fever  . Cough   (Consider location/radiation/quality/duration/timing/severity/associated sxs/prior Treatment) Patient is a [age] y.o. female presenting with fever and cough. The history is provided by the patient, the EMS personnel and the nursing home. The history is limited by the condition of the patient.  Fever Max temp prior to arrival:  100 oral Temp source:  Oral Severity:  Moderate Onset quality:  Gradual Timing:  Constant Progression:  Worsening Chronicity:  New Relieved by:  Nothing Ineffective treatments:  None tried Associated symptoms: cough   Cough Associated symptoms: fever    Level V caveat due to dementia.  Past Medical History  Diagnosis Date  . Hypertension   . Dementia   . Alzheimer's dementia    History reviewed. No pertinent past surgical history. No family history on file. History  Substance Use Topics  . Smoking status: Never Smoker   . Smokeless tobacco: Not on file  . Alcohol Use: Not on file   OB History   Grav Para Term Preterm Abortions TAB SAB Ect Mult Living                 Review of Systems  Unable to perform ROS: Dementia  Constitutional: Positive for fever.  Respiratory: Positive for cough.     Allergies  Review of patient's allergies indicates no known allergies.  Home Medications   Current Outpatient Rx  Name  Route  Sig  Dispense  Refill  . acetaminophen (TYLENOL) 325 MG tablet   Oral   Take 325 mg by mouth every 4 (four) hours as needed for mild pain.         . Artificial Tear Ointment (REFRESH LACRI-LUBE) OINT   Ophthalmic   Apply 1 application to eye at bedtime.         Marland Kitchen. aspirin 81 MG chewable tablet   Oral   Chew 81 mg by mouth daily.         Marland Kitchen. docusate (COLACE) 50 MG/5ML liquid   Oral   Take 100 mg by mouth daily.         Marland Kitchen. donepezil  (ARICEPT) 5 MG tablet   Oral   Take 5 mg by mouth at bedtime.         . furosemide (LASIX) 40 MG tablet   Oral   Take 40 mg by mouth daily.         Marland Kitchen. levofloxacin (LEVAQUIN) 250 MG tablet   Oral   Take 500 mg by mouth daily. X 10 days         . levothyroxine (SYNTHROID, LEVOTHROID) 25 MCG tablet   Oral   Take 25 mcg by mouth daily before breakfast.         . lisinopril (PRINIVIL,ZESTRIL) 5 MG tablet   Oral   Take 5 mg by mouth daily.         Marland Kitchen. loteprednol (LOTEMAX) 0.5 % ophthalmic suspension   Both Eyes   Place 1 drop into both eyes every morning.         . Multiple Vitamins-Minerals (MULTIVITAMINS THER. W/MINERALS) TABS tablet   Oral   Take by mouth daily.         . polyethylene glycol (MIRALAX / GLYCOLAX) packet   Oral   Take 17 g by mouth daily.         .Marland Kitchen  potassium chloride 20 MEQ/15ML (10%) solution   Oral   Take 7.5 mEq by mouth 2 (two) times daily.         Marland Kitchen sulfamethoxazole-trimethoprim (BACTRIM DS) 800-160 MG per tablet   Oral   Take 1 tablet by mouth 2 (two) times daily.          BP 142/55  Pulse 94  Temp(Src) 100 F (37.8 C)  Resp 20  SpO2 94% Physical Exam  Nursing note and vitals reviewed. Constitutional: She is oriented to person, place, and time. She appears well-developed and well-nourished. No distress.  HENT:  Head: Normocephalic and atraumatic.  Eyes: EOM are normal. Pupils are equal, round, and reactive to light.  Neck: Normal range of motion. Neck supple.  Cardiovascular: Normal rate and regular rhythm.  Exam reveals no friction rub.   No murmur heard. Pulmonary/Chest: Effort normal. No respiratory distress. She has decreased breath sounds (diffuse). She has no wheezes. She has no rales.  Abdominal: Soft. She exhibits no distension. There is no tenderness. There is no rebound.  Musculoskeletal: Normal range of motion. She exhibits no edema.  Neurological: She is alert and oriented to person, place, and time. She  exhibits normal muscle tone. Coordination normal.  Skin: No rash noted. She is not diaphoretic.    ED Course  Procedures (including critical care time) Labs Review Labs Reviewed  CBC - Abnormal; Notable for the following:    RBC 3.82 (*)    HCT 35.6 (*)    All other components within normal limits  BASIC METABOLIC PANEL - Abnormal; Notable for the following:    Glucose, Bld 122 (*)    Creatinine, Ser 1.38 (*)    GFR calc non Af Amer 31 (*)    GFR calc Af Amer 36 (*)    All other components within normal limits  CULTURE, BLOOD (ROUTINE X 2)  CULTURE, BLOOD (ROUTINE X 2)  URINE CULTURE  URINALYSIS, ROUTINE W REFLEX MICROSCOPIC  INFLUENZA PANEL BY PCR (TYPE A & B, H1N1)  CG4 I-STAT (LACTIC ACID)  POCT I-STAT TROPONIN I   Imaging Review No results found.  EKG Interpretation   None       MDM   1. Hypoxia    9F presents with hypoxia, fever. Patient here from a nursing home. Hypoxic with EMS, corrected with 2 L by [place]. Patient here with fever, hypoxia. No respiratory distress. She is nonverbal. Belly benign, poor inspiratory effort, difficult to obtain lung exam. Has DNR here.  Concern for pneumonia. PNA free on CXR. Patient admitted for possible influenza. No leukocytosis, no antibiotics given reflexively.     Dagmar Hait, MD 06/23/13 2012

## 2013-06-24 DIAGNOSIS — R799 Abnormal finding of blood chemistry, unspecified: Secondary | ICD-10-CM

## 2013-06-24 DIAGNOSIS — J209 Acute bronchitis, unspecified: Secondary | ICD-10-CM

## 2013-06-24 DIAGNOSIS — J09X2 Influenza due to identified novel influenza A virus with other respiratory manifestations: Principal | ICD-10-CM | POA: Diagnosis present

## 2013-06-24 DIAGNOSIS — R7989 Other specified abnormal findings of blood chemistry: Secondary | ICD-10-CM | POA: Diagnosis present

## 2013-06-24 LAB — BASIC METABOLIC PANEL
BUN: 30 mg/dL — AB (ref 6–23)
CO2: 20 mEq/L (ref 19–32)
CREATININE: 1.49 mg/dL — AB (ref 0.50–1.10)
Calcium: 8.7 mg/dL (ref 8.4–10.5)
Chloride: 105 mEq/L (ref 96–112)
GFR, EST AFRICAN AMERICAN: 33 mL/min — AB (ref >90)
GFR, EST NON AFRICAN AMERICAN: 28 mL/min — AB (ref >90)
GLUCOSE: 124 mg/dL — AB (ref 70–99)
Potassium: 4 mEq/L (ref 3.7–5.3)
Sodium: 141 mEq/L (ref 137–147)

## 2013-06-24 LAB — URINE CULTURE
COLONY COUNT: NO GROWTH
CULTURE: NO GROWTH
Special Requests: NORMAL

## 2013-06-24 LAB — INFLUENZA PANEL BY PCR (TYPE A & B)
H1N1FLUPCR: NOT DETECTED
Influenza A By PCR: POSITIVE — AB
Influenza B By PCR: NEGATIVE

## 2013-06-24 MED ORDER — OSELTAMIVIR PHOSPHATE 30 MG PO CAPS
30.0000 mg | ORAL_CAPSULE | Freq: Every day | ORAL | Status: DC
  Administered 2013-06-25: 30 mg via ORAL
  Filled 2013-06-24 (×2): qty 1

## 2013-06-24 MED ORDER — ENSURE COMPLETE PO LIQD
237.0000 mL | Freq: Two times a day (BID) | ORAL | Status: DC
  Administered 2013-06-24 – 2013-06-28 (×4): 237 mL via ORAL

## 2013-06-24 MED ORDER — GUAIFENESIN ER 600 MG PO TB12
600.0000 mg | ORAL_TABLET | Freq: Two times a day (BID) | ORAL | Status: DC
  Filled 2013-06-24: qty 1

## 2013-06-24 MED ORDER — ALBUTEROL SULFATE (2.5 MG/3ML) 0.083% IN NEBU
2.5000 mg | INHALATION_SOLUTION | Freq: Three times a day (TID) | RESPIRATORY_TRACT | Status: DC
  Administered 2013-06-25 – 2013-06-28 (×10): 2.5 mg via RESPIRATORY_TRACT
  Filled 2013-06-24 (×10): qty 3

## 2013-06-24 MED ORDER — LEVOFLOXACIN IN D5W 500 MG/100ML IV SOLN
500.0000 mg | INTRAVENOUS | Status: DC
  Filled 2013-06-24: qty 100

## 2013-06-24 MED ORDER — HYDRALAZINE HCL 20 MG/ML IJ SOLN: 10.0000 mg | Freq: Four times a day (QID) | INTRAMUSCULAR | Status: DC | PRN

## 2013-06-24 MED ORDER — SODIUM CHLORIDE 0.9 % IV SOLN: INTRAVENOUS | Status: DC

## 2013-06-24 NOTE — Progress Notes (Signed)
[facility] PROGRESS NOTE  Sophia Montgomery ZOX:[id number]:[id number] DOB: 1915/02/10 DOA: 06/23/2013 PCP: Florentina JennyRIPP, HENRY, MD  Assessment/Plan:  Influenza A PCR is positive.  Patient was started in [place]-flu on admission. Supportive care.  Levaquin discontinued.  Elevated Creatinine (1.49) Baseline not known.   Levaquin discontinued. Lisinopril and lasix on hold. Will monitor.  Hypertension BP stable off medications at this point. Will add prn hydralazine.  Alzheimer's dementia Will discontinue aricept as the patient's dementia is so advanced.  Hypothyroidism. Continue Synthroid.  Concern for aspiration Speech has evaluated the patient and started her on a Dys 2 diet.      DVT Prophylaxis:  heparin  Code Status: DNR Family Communication: attempted to call both numbers on face sheet to reach family - but unfortunately - no answer. Disposition Plan: inpatient.      HPI/Subjective: 80 year old female with severe dementia, hypertension, who was sent from the facility with 2 day history of cough with congestion with fever of 101F at the facility. Her oxygen saturation on room air was 87% and improved to 94% on 2 L via nasal cannula. Blood pressure per EMS was 131/68 mmHg with a heart rate of 92. At baseline the patient is nonverbal and severely demented. She was noted to be less active in the facility today. In the ED patient was found to have a temperature of 101.51F had normal heart rate, respiratory rate and O2 sat of 100% on 2 L via nasal cannula. A chest x-ray done was negative for any infiltrate.   Objective: Filed Vitals:   06/23/13 2112 06/23/13 2134 06/23/13 2215 06/24/13 0505  BP:   135/73 110/63  Pulse:   78 85  Temp: 100.5 F (38.1 C)  98.6 F (37 C) 98.7 F (37.1 C)  TempSrc: Rectal  Oral Oral  Resp:   20 22  Weight:  47.8 kg (105 lb 6.1 oz)    SpO2:   95% 98%   No intake or output data in the 24 hours ending 06/24/13 1218 Filed Weights   06/23/13 2134   Weight: 47.8 kg (105 lb 6.1 oz)    Exam: General: thin, elderly, non-verbal female, lying comfortably in bed. HEENT:  PERR, EOMI, Anicteic Sclera, MMM. No pharyngeal erythema or exudates  Neck: Supple, no JVD, no masses  Cardiovascular: irregular  S1 S2 auscultated, no rubs, murmurs or gallops.   Respiratory: Clear to auscultation bilaterally with equal chest rise  Abdomen: Soft, nontender, slightly distended but non-tender, + bowel sounds  Extremities: warm dry without cyanosis clubbing or edema.     Data Reviewed: Basic Metabolic Panel:  Recent Labs Lab 06/23/13 1830 06/24/13 0510  NA 140 141  K 4.2 4.0  CL 102 105  CO2 25 20  GLUCOSE 122* 124*  BUN 22 30*  CREATININE 1.38* 1.49*  CALCIUM 9.0 8.7   CBC:  Recent Labs Lab 06/23/13 1830  WBC 8.1  HGB 12.1  HCT 35.6*  MCV 93.2  PLT 156     Recent Results (from the past 240 hour(s))  MRSA PCR SCREENING     Status: None   Collection Time    06/23/13 10:15 PM      Result Value Range Status   MRSA by PCR NEGATIVE  NEGATIVE Final   Comment:            The GeneXpert MRSA Assay (FDA     approved for NASAL specimens     only), is one component of a     comprehensive MRSA colonization  surveillance program. It is not     intended to diagnose MRSA     infection nor to guide or     monitor treatment for     MRSA infections.     Studies: Dg Chest 2 View  06/23/2013   CLINICAL DATA:  Fever and cough  EXAM: CHEST  2 VIEW  COMPARISON:  DG CHEST 2V dated 06/23/2013  FINDINGS: Chronic central bronchial wall thickening is noted with diffuse mild prominence of the interstitial markings but no focal pulmonary opacity. The aorta is unfolded and ectatic. Mild enlargement of the cardiomediastinal silhouette is reidentified. Central thoracic vertebral compression fracture deformity is stable. No pleural effusion.  IMPRESSION: Stable chronic findings as above.   Electronically Signed   By: Christiana Pellant M.D.   On: 06/23/2013  18:40    Scheduled Meds: . albuterol  2.5 mg Nebulization Q6H  . artificial tears   Both Eyes QHS  . aspirin  81 mg Oral Daily  . dextromethorphan-guaiFENesin  1 tablet Oral BID  . docusate  100 mg Oral Daily  . guaiFENesin  600 mg Oral BID  . heparin  5,000 Units Subcutaneous Q8H  . levothyroxine  25 mcg Oral QAC breakfast  . loteprednol  1 drop Both Eyes BH-q7a  . multivitamin with minerals  1 tablet Oral Daily  . [START ON 06/25/2013] oseltamivir  30 mg Oral Daily  . polyethylene glycol  17 g Oral Daily   Continuous Infusions: . sodium chloride      Principal Problem:   Flu-like symptoms Active Problems:   Hypoxia   Hypertension   Alzheimer's dementia   AKI (acute kidney injury)   Acute bronchitis   Influenza due to identified novel influenza A virus with other respiratory manifestations   Elevated serum creatinine    Conley Canal  [facility] Pager [phone]. If 7PM-7AM, please contact night-coverage at [email or web address], password [facility] 06/24/2013, 12:18 PM  LOS: 1 day

## 2013-06-24 NOTE — Progress Notes (Signed)
INITIAL NUTRITION ASSESSMENT  DOCUMENTATION CODES Per approved criteria  -Severe malnutrition in the context of chronic illness   INTERVENTION: Add Ensure Complete po BID, each supplement provides 350 kcal and 13 grams of protein. RD to continue to follow nutrition care plan.  NUTRITION DIAGNOSIS: Inadequate oral intake related to poor appetite and AMS as evidenced by severe muscle and fat mass loss.   Goal: Intake to meet >90% of estimated nutrition needs.  Monitor:  weight trends, lab trends, I/O's, PO intake, supplement tolerance  Reason for Assessment: MD Consult for Assessment  80 y.o. female  Admitting Dx: Flu-like symptoms  ASSESSMENT: PMHx significant for severe dementia, HTN. From SNF. Admitted with cough with congestion and fever x 2 days. Work-up reveals Influenza A positive.  Pt is nonverbal at baseline. BSE completed by SLP on 1/26 with recommendations for Dysphagia 2 diet with thin liquids.  Pt with mittens on, did not waken for my exam.  Nutrition Focused Physical Exam:  Subcutaneous Fat:  Orbital Region: moderate depletion Upper Arm Region: severe depletion Thoracic and Lumbar Region: severe depletion  Muscle:  Temple Region: severe depletion Clavicle Bone Region: moderate depletion Clavicle and Acromion Bone Region: severe depletion Scapular Bone Region: n/a Dorsal Hand: n/a Patellar Region: n/a Anterior Thigh Region: n/a Posterior Calf Region: n/a  Edema: none  Pt meets criteria for severe MALNUTRITION in the context of chronic illness as evidenced by severe fat and muscle mass loss.   Height: Approx 5' (estimated by this RD)  Weight: Wt Readings from Last 1 Encounters:  06/23/13 105 lb 6.1 oz (47.8 kg)    Ideal Body Weight: 100 lb  % Ideal Body Weight: 105%  Wt Readings from Last 10 Encounters:  06/23/13 105 lb 6.1 oz (47.8 kg)    Usual Body Weight: n/a  % Usual Body Weight: n/a  BMI:  20.6  Estimated Nutritional  Needs: Kcal: 1500 - 1600 Protein: 55 - 65 g Fluid: 1.5 - 1.8 liters daily  Skin: intact  Diet Order: Dysphagia 2; thin  EDUCATION NEEDS: -No education needs identified at this time  No intake or output data in the 24 hours ending 06/24/13 1436  Last BM: 1/25  Labs:   Recent Labs Lab 06/23/13 1830 06/24/13 0510  NA 140 141  K 4.2 4.0  CL 102 105  CO2 25 20  BUN 22 30*  CREATININE 1.38* 1.49*  CALCIUM 9.0 8.7  GLUCOSE 122* 124*    CBG (last 3)  No results found for this basename: GLUCAP,  in the last 72 hours  Scheduled Meds: . albuterol  2.5 mg Nebulization Q6H  . artificial tears   Both Eyes QHS  . aspirin  81 mg Oral Daily  . dextromethorphan-guaiFENesin  1 tablet Oral BID  . docusate  100 mg Oral Daily  . guaiFENesin  600 mg Oral BID  . heparin  5,000 Units Subcutaneous Q8H  . levothyroxine  25 mcg Oral QAC breakfast  . loteprednol  1 drop Both Eyes BH-q7a  . multivitamin with minerals  1 tablet Oral Daily  . [START ON 06/25/2013] oseltamivir  30 mg Oral Daily  . polyethylene glycol  17 g Oral Daily    Continuous Infusions: . sodium chloride 50 mL/hr at 06/24/13 1215    Past Medical History  Diagnosis Date  . Hypertension   . Dementia   . Alzheimer's dementia     History reviewed. No pertinent past surgical history.  Sophia MottoSamantha Yostin Malacara MS, RD, LDN Pager: [phone] After-hours pager:  [phone]   

## 2013-06-24 NOTE — Progress Notes (Signed)
UR completed. Laiyah Exline RN CCM Case Mgmt 

## 2013-06-24 NOTE — Progress Notes (Signed)
Addendum  Patient seen and examined, chart and data base reviewed.  I agree with the above assessment and plan.  For full details please see Mrs. Algis DownsMarianne York PA note.  Influenza type A with respiratory manifestations, patient is on Tamiflu.   Clint LippsMutaz A Sharilyn Geisinger, MD [facility] Pager: [phone] 06/24/2013, 4:35 PM

## 2013-06-24 NOTE — Evaluation (Signed)
Clinical/Bedside Swallow Evaluation Patient Details  Name: Tedi Hughson MRN: [id number] Date of Birth: [birth year]  Today's Date: 06/24/2013 Time: 1120-1134 SLP Time Calculation (min): 14 min  Past Medical History:  Past Medical History  Diagnosis Date  . Hypertension   . Dementia   . Alzheimer's dementia    Past Surgical History: History reviewed. No pertinent past surgical history. HPI:  80 year old female with severe dementia, hypertension, who was sent from the facility with 2 day history of cough with congestion with fever of 101F at the facility. Her oxygen saturation on room air was 87% and improved to 94% on 2 L via nasal cannula. Blood pressure per EMS was 131/68 mmHg with a heart rate of 92. At baseline the patient is nonverbal and severely demented. She was noted to be less active in the facility today. In the ED patient was found to have a temperature of 101.53F had normal heart rate, respiratory rate and O2 sat of 100% on 2 L via nasal cannula. A chest x-ray done was negative for any infiltrate. Patient on exam had severe rhonchi bilaterally. Blood work done showed creatinine of 1.38. Prior hospitalist consulted for admission for fever with possible flulike symptoms.   Assessment / Plan / Recommendation Clinical Impression  Pt demonstrates function consistent with MBS dated 6/14, completed by this SLP. Pt has delayed swallow initiation, but no evidence of aspiration. She does have denutres, but they are not present today. Pt has demonstrated ability to tolerate ground solids in the past, will upgrade current diet to Dys 2/thin. No therapeutic f/u needed beyond basic aspiration precautions.     Aspiration Risk  Mild    Diet Recommendation Dysphagia 2 (Fine chop);Thin liquid   Liquid Administration via: Cup;Straw Medication Administration: Whole meds with puree Compensations: Slow rate;Small sips/bites Postural Changes and/or Swallow Maneuvers: Seated upright 90 degrees    Other  Recommendations Oral Care Recommendations: Oral care BID   Follow Up Recommendations  Skilled Nursing facility    Frequency and Duration        Pertinent Vitals/Pain NA    SLP Swallow Goals     Swallow Study Prior Functional Status       General HPI: 80 year old female with severe dementia, hypertension, who was sent from the facility with 2 day history of cough with congestion with fever of 101F at the facility. Her oxygen saturation on room air was 87% and improved to 94% on 2 L via nasal cannula. Blood pressure per EMS was 131/68 mmHg with a heart rate of 92. At baseline the patient is nonverbal and severely demented. She was noted to be less active in the facility today. In the ED patient was found to have a temperature of 101.53F had normal heart rate, respiratory rate and O2 sat of 100% on 2 L via nasal cannula. A chest x-ray done was negative for any infiltrate. Patient on exam had severe rhonchi bilaterally. Blood work done showed creatinine of 1.38. Prior hospitalist consulted for admission for fever with possible flulike symptoms. Type of Study: Bedside swallow evaluation Previous Swallow Assessment: MBS 6/14 - Dys 2/thin  delayed swallow, no penetration/aspiration Diet Prior to this Study: Dysphagia 1 (puree);Thin liquids Temperature Spikes Noted: Yes Respiratory Status: Nasal cannula History of Recent Intubation: No Behavior/Cognition: Alert;Cooperative;Pleasant mood;Confused;Requires cueing Oral Cavity - Dentition: Edentulous;Dentures, not available Self-Feeding Abilities: Needs assist Patient Positioning: Upright in bed Baseline Vocal Quality: Other (comment) (does not attempt to phonate) Volitional Cough: Cognitively unable to elicit Volitional Swallow: Unable to elicit  Oral/Motor/Sensory Function Overall Oral Motor/Sensory Function: Appears within functional limits for tasks assessed   Ice Chips     Thin Liquid Thin Liquid: Impaired Presentation:  Cup;Straw Oral Phase Impairments: Reduced labial seal Oral Phase Functional Implications: Right anterior spillage Pharyngeal  Phase Impairments: Suspected delayed Swallow    Nectar Thick Nectar Thick Liquid: Not tested   Honey Thick Honey Thick Liquid: Not tested   Puree Puree: Impaired Presentation: Spoon Oral Phase Impairments: Impaired anterior to posterior transit Oral Phase Functional Implications: Prolonged oral transit Pharyngeal Phase Impairments: Suspected delayed Swallow   Solid   GO    Solid: Not tested      Harlon DittyBonnie Desa Rech, MA CCC-SLP [phone]  Verlisa Vara, Riley NearingBonnie Caroline 06/24/2013,11:34 AM

## 2013-06-25 LAB — BASIC METABOLIC PANEL
BUN: 29 mg/dL — AB (ref 6–23)
CO2: 22 mEq/L (ref 19–32)
Calcium: 8.9 mg/dL (ref 8.4–10.5)
Chloride: 109 mEq/L (ref 96–112)
Creatinine, Ser: 1.2 mg/dL — ABNORMAL HIGH (ref 0.50–1.10)
GFR calc Af Amer: 43 mL/min — ABNORMAL LOW (ref >90)
GFR, EST NON AFRICAN AMERICAN: 37 mL/min — AB (ref >90)
Glucose, Bld: 92 mg/dL (ref 70–99)
Potassium: 3.8 mEq/L (ref 3.7–5.3)
Sodium: 145 mEq/L (ref 137–147)

## 2013-06-25 MED ORDER — ALBUTEROL SULFATE (2.5 MG/3ML) 0.083% IN NEBU
INHALATION_SOLUTION | RESPIRATORY_TRACT | Status: AC
  Administered 2013-06-25: 2.5 mg via RESPIRATORY_TRACT
  Filled 2013-06-25: qty 3

## 2013-06-25 NOTE — Progress Notes (Signed)
Addendum  Patient seen and examined, chart and data base reviewed.  I agree with the above assessment and plan.  For full details please see Mrs. Algis DownsMarianne York PA note.  Influenza type A with hypoxic respiratory failure, advanced age.  Palliative consults called.   Clint LippsMutaz A Arwin Bisceglia, MD [facility] Pager: [phone] 06/25/2013, 7:12 PM

## 2013-06-25 NOTE — Evaluation (Signed)
Physical Therapy Evaluation Patient Details Name: Sophia CoupeLucille Vidrio MRN: [id number] DOB: 10/20/1914 Today's Date: 06/25/2013 Time: [id number] PT Time Calculation (min): 17 min  PT Assessment / Plan / Recommendation History of Present Illness  80 year old female with severe dementia, hypertension, who was sent from the facility with 2 day history of cough with congestion with fever of 101F at the facility. Her oxygen saturation on room air was 87% and improved to 94% on 2 L via nasal cannula. Blood pressure per EMS was 131/68 mmHg with a heart rate of 92. At baseline the patient is nonverbal and severely demented. She was noted to be less active in the facility today. In the ED patient was found to have a temperature of 101.63F had normal heart rate, respiratory rate and O2 sat of 100% on 2 L via nasal cannula. A chest x-ray done was negative for any infiltrate. Patient on exam had severe rhonchi bilaterally. Blood work done showed creatinine of 1.38. Prior hospitalist consulted for admission for fever with possible flulike symptoms.  Clinical Impression  Pt. Admitted for above; PLOF unknown at this time however anticipate pt is at or near baseline given advanced dementia and clinical findings.  Pt unable to participate in therapy therefore no skilled needs identified.  PT to sign off.    PT Assessment  Patent does not need any further PT services    Follow Up Recommendations  No PT follow up    Does the patient have the potential to tolerate intense rehabilitation      Barriers to Discharge        Equipment Recommendations  None recommended by PT    Recommendations for Other Services     Frequency      Precautions / Restrictions Precautions Precautions: Fall Restrictions Weight Bearing Restrictions: No   Pertinent Vitals/Pain No pain reports however pt unable to state      Mobility  Bed Mobility Overal bed mobility: Needs Assistance Bed Mobility: Supine to Sit Supine to sit:  Total assist;HOB elevated General bed mobility comments: pt. unable to be aroused with sternal rub therefore initiated supine to sit; pt. opened eyes and maintained being awake during session Transfers Overall transfer level: Needs assistance Equipment used: None Transfers: Lateral/Scoot Transfers  Lateral/Scoot Transfers: Total assist General transfer comment: pt. unable to participate in therapy and not following commands    Exercises     PT Diagnosis:    PT Problem List:   PT Treatment Interventions:       PT Goals(Current goals can be found in the care plan section) Acute Rehab PT Goals Patient Stated Goal: unable to state PT Goal Formulation: No goals set, d/c therapy  Visit Information  Last PT Received On: 06/25/13 Assistance Needed: +1 History of Present Illness: 80 year old female with severe dementia, hypertension, who was sent from the facility with 2 day history of cough with congestion with fever of 101F at the facility. Her oxygen saturation on room air was 87% and improved to 94% on 2 L via nasal cannula. Blood pressure per EMS was 131/68 mmHg with a heart rate of 92. At baseline the patient is nonverbal and severely demented. She was noted to be less active in the facility today. In the ED patient was found to have a temperature of 101.63F had normal heart rate, respiratory rate and O2 sat of 100% on 2 L via nasal cannula. A chest x-ray done was negative for any infiltrate. Patient on exam had severe rhonchi bilaterally. Blood work done  showed creatinine of 1.38. Prior hospitalist consulted for admission for fever with possible flulike symptoms.       Prior Functioning  Home Living Family/patient expects to be discharged to:: Skilled nursing facility Prior Function Level of Independence: Needs assistance Gait / Transfers Assistance Needed: unknown prior level of function; anticipate non ambulatory given cognitive status and bil plantarflexion contractures; anticipate  total A for transfers ADL's / Homemaking Assistance Needed: total assistance Communication / Swallowing Assistance Needed: non verbal Communication Communication: Receptive difficulties;Expressive difficulties    Cognition  Cognition Arousal/Alertness: Lethargic (woke up with mobility and sitting EOB) Behavior During Therapy: Flat affect Overall Cognitive Status: History of cognitive impairments - at baseline Memory: Decreased recall of precautions;Decreased short-term memory    Extremity/Trunk Assessment Upper Extremity Assessment Upper Extremity Assessment: Generalized weakness Lower Extremity Assessment Lower Extremity Assessment: Generalized weakness Cervical / Trunk Assessment Cervical / Trunk Assessment: Kyphotic   Balance Balance Overall balance assessment: Needs assistance;History of Falls Sitting-balance support: Feet supported;Single extremity supported Sitting balance-Leahy Scale: Fair Sitting balance - Comments: once sitting EOB pt. able to maintain sitting EOB with supervision  End of Session PT - End of Session Equipment Utilized During Treatment: Oxygen (pt. took O2 off x 3 during session) Activity Tolerance: Patient tolerated treatment well (limited due to cognition and participation) Patient left: in chair;with call bell/phone within reach;with nursing/sitter in room (respiratory therapist in room) Nurse Communication: Mobility status  GP     Moshe Cipro K 06/25/2013, 3:29 PM  Clarita Crane, PT, DPT [phone]

## 2013-06-25 NOTE — Progress Notes (Signed)
Thank you for consulting the Pall[facility] at [facility] to meet your patient's and family's needs.   The reason that you asked us to see your patient is  For GOC  We have scheduled your patient for a meeting: 1/27 4 pm   The Surrogate decision make is: son and daughter in law. Earliest available for family. Contact information:  Other family members that need to be present:     Your patient is able/unable to participate:unable  Additional Narrative:    Surina Storts L. Ladona Ridgelaylor, MD MBA The Palliative Medicine Team at [facility] Team Phone: [phone] Pager: [phone]

## 2013-06-25 NOTE — Progress Notes (Signed)
[facility] PROGRESS NOTE  Sophia Montgomery ZOX:[id number] DOB: May 25, 1915 DOA: 06/23/2013 PCP: Florentina Jenny, MD  Assessment/Plan:  Influenza A with acute hypoxic respiratory failure. PCR is positive.  Patient was started in [place]-flu on admission. Supportive care.  Patient was not on oxygen prior to admission.  Now requiring 2L or she drops to 80% sats. Levaquin discontinued.  Elevated Creatinine (1.49) Baseline not known.   Creatinine decreasing nicely.   Levaquin discontinued. Lisinopril and lasix on hold. Will monitor.  Hypertension BP stable off medications at this point still with moderate control Will add prn hydralazine.  Alzheimer's dementia Will discontinue aricept as the patient's dementia is so advanced.  Hypothyroidism. Continue Synthroid.  Concern for aspiration Speech has evaluated the patient and started her on a Dys 2 diet.  Palliative Medicine Spoke with daughter in law, Aleida Crandell, who agrees patient should be made comfortable and should not ever have to return to the hospital.  Palliative Medicine consultation requested.  Severe malnutrition of chronic disease      DVT Prophylaxis:  heparin  Code Status: DNR Family Communication: spoke with daughter in Nutritional therapist. Disposition Plan: inpatient.  Will likely need SNF vs Residential Hospice at discharge.      HPI/Subjective: 80 year old female with severe dementia, hypertension, who was sent from the facility with 2 day history of cough with congestion with fever of 101F at the facility. Her oxygen saturation on room air was 87% and improved to 94% on 2 L via nasal cannula. Blood pressure per EMS was 131/68 mmHg with a heart rate of 92. At baseline the patient is nonverbal and severely demented. She was noted to be less active in the facility today. In the ED patient was found to have a temperature of 101.73F had normal heart rate, respiratory rate and O2 sat of 100% on 2 L via nasal  cannula. A chest x-ray done was negative for any infiltrate.   Objective: Filed Vitals:   06/24/13 1959 06/24/13 2317 06/25/13 0345 06/25/13 0815  BP: 159/73 127/67 133/51   Pulse: 76  76   Temp: 99.3 F (37.4 C)  98.4 F (36.9 C)   TempSrc: Oral  Oral   Resp: 20  20   Weight:      SpO2: 98%  90% 91%   No intake or output data in the 24 hours ending 06/25/13 1355 Filed Weights   06/23/13 2134  Weight: 47.8 kg (105 lb 6.1 oz)    Exam: General: thin, elderly, non-verbal female, lying comfortably in bed. HEENT:  PERR, EOMI, Anicteic Sclera, MMM. No pharyngeal erythema or exudates  Neck: Supple, no JVD, no masses  Cardiovascular: irregular  S1 S2 auscultated, no rubs, murmurs or gallops.   Respiratory: course wet sounding breath sounds likely from upper airway. Abdomen: Soft, nontender, slightly distended but non-tender, + bowel sounds  Extremities: warm dry without cyanosis clubbing or edema.     Data Reviewed: Basic Metabolic Panel:  Recent Labs Lab 06/23/13 1830 06/24/13 0510 06/25/13 0659  NA 140 141 145  K 4.2 4.0 3.8  CL 102 105 109  CO2 25 20 22   GLUCOSE 122* 124* 92  BUN 22 30* 29*  CREATININE 1.38* 1.49* 1.20*  CALCIUM 9.0 8.7 8.9   CBC:  Recent Labs Lab 06/23/13 1830  WBC 8.1  HGB 12.1  HCT 35.6*  MCV 93.2  PLT 156     Recent Results (from the past 240 hour(s))  CULTURE, BLOOD (ROUTINE X 2)     Status:  None   Collection Time    06/23/13  6:30 PM      Result Value Range Status   Specimen Description BLOOD LEFT ARM   Final   Special Requests BOTTLES DRAWN AEROBIC AND ANAEROBIC 5CC EA   Final   Culture  Setup Time     Final   Value: 06/24/2013 01:15     Performed at [facility]   Culture     Final   Value:        BLOOD CULTURE RECEIVED NO GROWTH TO DATE CULTURE WILL BE HELD FOR 5 DAYS BEFORE ISSUING A FINAL NEGATIVE REPORT     Performed at [facility]   Report Status PENDING   Incomplete  URINE CULTURE     Status: None    Collection Time    06/23/13  7:31 PM      Result Value Range Status   Specimen Description URINE, CATHETERIZED   Final   Special Requests Normal   Final   Culture  Setup Time     Final   Value: 06/24/2013 01:22     Performed at [facility] Count     Final   Value: NO GROWTH     Performed at [facility]   Culture     Final   Value: NO GROWTH     Performed at [facility]   Report Status 06/24/2013 FINAL   Final  CULTURE, BLOOD (ROUTINE X 2)     Status: None   Collection Time    06/23/13  7:38 PM      Result Value Range Status   Specimen Description BLOOD RIGHT ARM   Final   Special Requests BOTTLES DRAWN AEROBIC ONLY 8CC   Final   Culture  Setup Time     Final   Value: 06/24/2013 01:15     Performed at [facility]   Culture     Final   Value:        BLOOD CULTURE RECEIVED NO GROWTH TO DATE CULTURE WILL BE HELD FOR 5 DAYS BEFORE ISSUING A FINAL NEGATIVE REPORT     Performed at [facility]   Report Status PENDING   Incomplete  MRSA PCR SCREENING     Status: None   Collection Time    06/23/13 10:15 PM      Result Value Range Status   MRSA by PCR NEGATIVE  NEGATIVE Final   Comment:            The GeneXpert MRSA Assay (FDA     approved for NASAL specimens     only), is one component of a     comprehensive MRSA colonization     surveillance program. It is not     intended to diagnose MRSA     infection nor to guide or     monitor treatment for     MRSA infections.     Studies: Dg Chest 2 View  06/23/2013   CLINICAL DATA:  Fever and cough  EXAM: CHEST  2 VIEW  COMPARISON:  DG CHEST 2V dated 06/23/2013  FINDINGS: Chronic central bronchial wall thickening is noted with diffuse mild prominence of the interstitial markings but no focal pulmonary opacity. The aorta is unfolded and ectatic. Mild enlargement of the cardiomediastinal silhouette is reidentified. Central thoracic vertebral compression fracture deformity is stable. No  pleural effusion.  IMPRESSION: Stable chronic findings as above.   Electronically Signed  By: Christiana PellantGretchen  Green M.D.   On: 06/23/2013 18:40    Scheduled Meds: . albuterol  2.5 mg Nebulization TID  . artificial tears   Both Eyes QHS  . aspirin  81 mg Oral Daily  . dextromethorphan-guaiFENesin  1 tablet Oral BID  . docusate  100 mg Oral Daily  . feeding supplement (ENSURE COMPLETE)  237 mL Oral BID BM  . heparin  5,000 Units Subcutaneous Q8H  . levothyroxine  25 mcg Oral QAC breakfast  . loteprednol  1 drop Both Eyes [id number]  . multivitamin with minerals  1 tablet Oral Daily  . oseltamivir  30 mg Oral Daily  . polyethylene glycol  17 g Oral Daily   Continuous Infusions:    Principal Problem:   Flu-like symptoms Active Problems:   Hypoxia   Hypertension   Alzheimer's dementia   AKI (acute kidney injury)   Acute bronchitis   Influenza due to identified novel influenza A virus with other respiratory manifestations   Elevated serum creatinine    Conley CanalYork, Lis Savitt L, PA-C  [facility] Pager [phone]. If 7PM-7AM, please contact night-coverage at [email or web address], password [email or web address] 06/25/2013, 1:55 PM  LOS: 2 days

## 2013-06-26 DIAGNOSIS — Z515 Encounter for palliative care: Secondary | ICD-10-CM

## 2013-06-26 DIAGNOSIS — G309 Alzheimer's disease, unspecified: Secondary | ICD-10-CM

## 2013-06-26 DIAGNOSIS — J09X2 Influenza due to identified novel influenza A virus with other respiratory manifestations: Secondary | ICD-10-CM

## 2013-06-26 DIAGNOSIS — R6889 Other general symptoms and signs: Secondary | ICD-10-CM

## 2013-06-26 DIAGNOSIS — F0280 Dementia in other diseases classified elsewhere without behavioral disturbance: Secondary | ICD-10-CM

## 2013-06-26 DIAGNOSIS — R0902 Hypoxemia: Secondary | ICD-10-CM

## 2013-06-26 DIAGNOSIS — N179 Acute kidney failure, unspecified: Secondary | ICD-10-CM

## 2013-06-26 DIAGNOSIS — J209 Acute bronchitis, unspecified: Secondary | ICD-10-CM

## 2013-06-26 MED ORDER — MORPHINE SULFATE 2 MG/ML IJ SOLN
1.0000 mg | INTRAMUSCULAR | Status: DC | PRN
  Administered 2013-06-28: 1 mg via INTRAVENOUS
  Filled 2013-06-26: qty 1

## 2013-06-26 MED ORDER — DIAZEPAM 1 MG/ML PO SOLN
5.0000 mg | Freq: Two times a day (BID) | ORAL | Status: DC
  Administered 2013-06-26 – 2013-06-28 (×4): 5 mg via ORAL
  Filled 2013-06-26 (×4): qty 5

## 2013-06-26 MED ORDER — SCOPOLAMINE 1 MG/3DAYS TD PT72
1.0000 | MEDICATED_PATCH | TRANSDERMAL | Status: DC
Start: 2013-06-26 — End: 2013-06-28
  Administered 2013-06-26: 1.5 mg via TRANSDERMAL
  Filled 2013-06-26: qty 1

## 2013-06-26 NOTE — Progress Notes (Signed)
[facility] PROGRESS NOTE  Augustin CoupeLucille Teehan MVH:[id number] DOB: [birth year] DOA: 06/23/2013 PCP: Florentina JennyRIPP, HENRY, MD  Assessment/Plan:  Unfortunately Ms. Sophia Montgomery has stopped responding appropriately.  She is pocketing all medications and food.  I feel she is unable to support her own nutritional needs and may be ready for residential hospice.    She was from an ALF with memory care prior to this admission.  Influenza A with acute hypoxic respiratory failure. PCR is positive.  Patient was started on Tami-flu on admission. Unfortunately patient has begun pocketing her food and medications.  Supportive care.  Patient was not on oxygen prior to admission.  Now requiring 2L or she drops to 80% sats. Levaquin discontinued. Patient seems to have a lot of secretions in her throat that she is too weak to clear.  Will add scopalamine patch.  Elevated Creatinine (1.49) Baseline not known.   Creatinine decreasing nicely.   Levaquin discontinued. Lisinopril and lasix on hold. Will monitor.  Hypertension BP stable off medications at this point still with moderate control Will add prn hydralazine.  Alzheimer's dementia Will discontinue aricept as the patient's dementia is so advanced.  Hypothyroidism. Continue Synthroid.  Concern for aspiration Speech has evaluated the patient and started her on a Dys 2 diet.  Palliative Medicine Spoke with daughter in law, Kern ReapDeborah Leckrone, who agrees patient should be made comfortable and should not ever have to return to the hospital.  Palliative Medicine consultation requested.  Severe malnutrition of chronic disease      DVT Prophylaxis:  heparin  Code Status: DNR Family Communication: spoke with daughter in Nutritional therapistlaw Deborah. Disposition Plan: inpatient.  Will likely need SNF vs Residential Hospice at discharge.      HPI/Subjective: 80 year old female with severe dementia, hypertension, who was sent from the facility with 2 day history of cough  with congestion with fever of 101F at the facility. Her oxygen saturation on room air was 87% and improved to 94% on 2 L via nasal cannula. Blood pressure per EMS was 131/68 mmHg with a heart rate of 92. At baseline the patient is nonverbal and severely demented. She was noted to be less active in the facility today. In the ED patient was found to have a temperature of 101.60F had normal heart rate, respiratory rate and O2 sat of 100% on 2 L via nasal cannula. A chest x-ray done was negative for any infiltrate.   Objective: Filed Vitals:   06/25/13 1518 06/25/13 2202 06/26/13 0651 06/26/13 0818  BP:  141/75 143/58   Pulse:  58 52   Temp:  97.7 F (36.5 C) 98.4 F (36.9 C)   TempSrc:  Axillary Axillary   Resp:  18 18   Weight:      SpO2: 92% 94% 100% 92%    Intake/Output Summary (Last 24 hours) at 06/26/13 1215 Last data filed at 06/26/13 0908  Gross per 24 hour  Intake      0 ml  Output      0 ml  Net      0 ml   Filed Weights   06/23/13 2134  Weight: 47.8 kg (105 lb 6.1 oz)    Exam: General: thin, elderly, non-verbal female, lying comfortably in bed.  Does not wake to my exam. HEENT:  PERR, EOMI, Anicteic Sclera, MMM. No pharyngeal erythema or exudates  Neck: Supple, no JVD, no masses  Cardiovascular: irregular  S1 S2 auscultated, no rubs, murmurs or gallops.   Respiratory: course wet sounding breath sounds likely  from upper airway. Abdomen: Soft, nontender, slightly distended but non-tender, + bowel sounds  Extremities: warm dry without cyanosis clubbing or edema.     Data Reviewed: Basic Metabolic Panel:  Recent Labs Lab 06/23/13 1830 06/24/13 0510 06/25/13 0659  NA 140 141 145  K 4.2 4.0 3.8  CL 102 105 109  CO2 25 20 22   GLUCOSE 122* 124* 92  BUN 22 30* 29*  CREATININE 1.38* 1.49* 1.20*  CALCIUM 9.0 8.7 8.9   CBC:  Recent Labs Lab 06/23/13 1830  WBC 8.1  HGB 12.1  HCT 35.6*  MCV 93.2  PLT 156     Recent Results (from the past 240 hour(s))   CULTURE, BLOOD (ROUTINE X 2)     Status: None   Collection Time    06/23/13  6:30 PM      Result Value Range Status   Specimen Description BLOOD LEFT ARM   Final   Special Requests BOTTLES DRAWN AEROBIC AND ANAEROBIC 5CC EA   Final   Culture  Setup Time     Final   Value: 06/24/2013 01:15     Performed at [facility]   Culture     Final   Value:        BLOOD CULTURE RECEIVED NO GROWTH TO DATE CULTURE WILL BE HELD FOR 5 DAYS BEFORE ISSUING A FINAL NEGATIVE REPORT     Performed at [facility]   Report Status PENDING   Incomplete  URINE CULTURE     Status: None   Collection Time    06/23/13  7:31 PM      Result Value Range Status   Specimen Description URINE, CATHETERIZED   Final   Special Requests Normal   Final   Culture  Setup Time     Final   Value: 06/24/2013 01:22     Performed at [facility] Count     Final   Value: NO GROWTH     Performed at [facility]   Culture     Final   Value: NO GROWTH     Performed at [facility]   Report Status 06/24/2013 FINAL   Final  CULTURE, BLOOD (ROUTINE X 2)     Status: None   Collection Time    06/23/13  7:38 PM      Result Value Range Status   Specimen Description BLOOD RIGHT ARM   Final   Special Requests BOTTLES DRAWN AEROBIC ONLY 8CC   Final   Culture  Setup Time     Final   Value: 06/24/2013 01:15     Performed at [facility]   Culture     Final   Value:        BLOOD CULTURE RECEIVED NO GROWTH TO DATE CULTURE WILL BE HELD FOR 5 DAYS BEFORE ISSUING A FINAL NEGATIVE REPORT     Performed at [facility]   Report Status PENDING   Incomplete  MRSA PCR SCREENING     Status: None   Collection Time    06/23/13 10:15 PM      Result Value Range Status   MRSA by PCR NEGATIVE  NEGATIVE Final   Comment:            The GeneXpert MRSA Assay (FDA     approved for NASAL specimens     only), is one component of a     comprehensive MRSA colonization     surveillance  program. It is not     intended to diagnose MRSA     infection nor to guide or     monitor treatment for     MRSA infections.     Studies: No results found.  Scheduled Meds: . albuterol  2.5 mg Nebulization TID  . artificial tears   Both Eyes QHS  . aspirin  81 mg Oral Daily  . dextromethorphan-guaiFENesin  1 tablet Oral BID  . docusate  100 mg Oral Daily  . feeding supplement (ENSURE COMPLETE)  237 mL Oral BID BM  . heparin  5,000 Units Subcutaneous Q8H  . levothyroxine  25 mcg Oral QAC breakfast  . loteprednol  1 drop Both Eyes BH-q7a  . multivitamin with minerals  1 tablet Oral Daily  . oseltamivir  30 mg Oral Daily  . polyethylene glycol  17 g Oral Daily  . scopolamine  1 patch Transdermal Q72H   Continuous Infusions:    Principal Problem:   Flu-like symptoms Active Problems:   Hypoxia   Hypertension   Alzheimer's dementia   AKI (acute kidney injury)   Acute bronchitis   Influenza due to identified novel influenza A virus with other respiratory manifestations   Elevated serum creatinine    Conley Canal  [facility] Pager [phone]. If 7PM-7AM, please contact night-coverage at [email or web address], password [facility] 06/26/2013, 12:15 PM  LOS: 3 days   Attending - Seen and examined, agree with the above assessment and plan. Await palliative care evaluation to determine disposition. Would likely benefit from residential hospice.  Windell Norfolk MD

## 2013-06-26 NOTE — Care Management Note (Signed)
    Page 1 of 1   06/28/2013     12:41:59 PM   CARE MANAGEMENT NOTE 06/28/2013  Patient:  Sophia Montgomery,Sophia Montgomery   Account Number:  [id number]  Date Initiated:  06/25/2013  Documentation initiated by:  Letha CapeAYLOR,Badr Piedra  Subjective/Objective Assessment:   dx flu like sx's with hypoxic resp failure  admit-     Action/Plan:   pt eval- unable to participate, no pt f/u needed.   Anticipated DC Date:  06/28/2013   Anticipated DC Plan:  HOSPICE MEDICAL FACILITY      DC Planning Services  CM consult      Choice offered to / List presented to:             Status of service:  Completed, signed off Medicare Important Message given?   (If response is "NO", the following Medicare IM given date fields will be blank) Date Medicare IM given:   Date Additional Medicare IM given:    Discharge Disposition:  HOSPICE MEDICAL FACILITY  Per UR Regulation:  Reviewed for med. necessity/level of care/duration of stay  If discussed at Long Length of Stay Meetings, dates discussed:    Comments:  06/28/13 12:40 Letha Capeeborah Ricki Vanhandel RN, BSN [phone] patient is for dc to [facility] today, CSW following.  06/26/13 Letha Capeeborah Ahmaad Neidhardt RN, BSN [phone] NCM spoke with Delray AltMargie at [facility] , NCM asked if they could look at patient for GIP since they did not have any beds available for residential, she states yes , but everyone is out sick today and she has no one to come over to hospital until tomorrow.  She states they may end up having a bed available as well and if not she will look into GIP tomorrow.  06/25/13 Letha Capeeborah Yarethzy Croak RN, BSN [phone] per phsycial therapy , patient unable to participate, NCM will continue to follow for dc needs.

## 2013-06-26 NOTE — Clinical Social Work Psychosocial (Signed)
Clinical Social Work Department BRIEF PSYCHOSOCIAL ASSESSMENT 06/26/2013  Patient:  Rane,Ellakate     Account Number:  [id number]     Admit date:  06/23/2013  Clinical Social Worker:  Lavell LusterAMPBELL,Quinterrius Errington BRYANT, LCSWA  Date/Time:  06/26/2013 02:00 PM  Referred by:  Physician  Date Referred:  06/26/2013 Referred for  ALF Placement   Other Referral:   Interview type:  Other - See comment Other interview type:   Patient not alert and oriented. Patient's son Dorene SorrowJerry interviewed over phone as he is not at bedside.    PSYCHOSOCIAL DATA Living Status:  FACILITY Admitted from facility:  OTHER Level of care:  Assisted Living Primary support name:  Dorene SorrowJerry Primary support relationship to patient:  CHILD, ADULT Degree of support available:   Support is adequate.    CURRENT CONCERNS Current Concerns  Post-Acute Placement   Other Concerns:    SOCIAL WORK ASSESSMENT / PLAN CSW spoke with patient's son over phone to discuss disposition. Son confirms that [facility] is [facility]. Son states that patient has been a resident there for a "long time". He states that if patient is not appropriate to return to [facility] that he would like to see patient go to SNF. Treatment team feels that SNF is more appropriate for patient. CSW will do SNF search but also update ALF. Patient's son [place] have a [place] [facility] [place] close [place] his residence. Son states that patient is NOT at her baseline from a mobility perspective and that she was able to walk around with her walker before this hospital admission. CSW notes that PMT is involved and this could influence disposition. CSW will follow up with patient's son after PMT meeting.   Assessment/plan status:  Psychosocial Support/Ongoing Assessment of Needs Other assessment/ plan:   Complete FL2, Fax   Information/referral to community resources:   CSW contact information given to son.     PATIENT'S/FAMILY'S RESPONSE TO PLAN OF CARE: Son is agreeable to patient's return to ALF memory unit if appropriate or SNF if indicated. Patient's son was appropriate, pleasant, and appreciative of CSW contact. Son waits for bed offers.       Roddie McBryant Mattilynn Forrer, [place]CSWA, [place], [phone]

## 2013-06-26 NOTE — Progress Notes (Signed)
Attempted to feed patient breakfast as well as administer morning medications. She took some eggs into her mouth but would not swallow. I tried to crush meds and give them in apple sauce and oatmeal but she would not open her mouth. Notified Dr. Jerral RalphGhimire of patient's oral intake status. Pt still receiving nebulizer treatments per respiratory. Will continue to monitor. Driggers, Energy East CorporationCortney Elizabeth

## 2013-06-26 NOTE — Progress Notes (Signed)
Noted pt admitted from SNF and returning to SNF.  Pt with severe advanced dementia and most likely was dependent for most adls PTA.  Will defer this eval back to SNF. Tory EmeraldHolly Arthor Gorter, North CarolinaOTR/L [phone]

## 2013-06-26 NOTE — Clinical Social Work Placement (Signed)
Clinical Social Work Department CLINICAL SOCIAL WORK PLACEMENT NOTE 06/26/2013  Patient:  Sophia Montgomery,Sophia Montgomery  Account Number:  [id number] Admit date:  06/23/2013  Clinical Social Worker:  Cherre BlancJOSEPH BRYANT Ulyses Panico, [place]CSWA  Date/time:  06/26/2013 03:00 PM  Clinical Social Work is seeking post-discharge placement for this patient at the following level of care:   SKILLED NURSING   (*CSW will update this form in Epic as items are completed)   06/26/2013  Patient/family provided with [facility] Department of Clinical Social Work's list of facilities offering this level of care within the geographic area requested by the patient (or if unable, by the patient's family).  06/26/2013  Patient/family informed of their freedom to choose among providers that offer the needed level of care, that participate in Medicare, Medicaid or managed care program needed by the patient, have an available bed and are willing to accept the patient.  06/26/2013  Patient/family informed of [facility]' ownership interest in [facility], as well as of the fact that they are under no obligation to receive care at this facility.  PASARR submitted to EDS on  PASARR number received from EDS on   FL2 transmitted to all facilities in geographic area requested by pt/family on  06/26/2013 FL2 transmitted to all facilities within larger geographic area on   Patient informed that his/her managed care company has contracts with or will negotiate with  certain facilities, including the following:     Patient/family informed of bed offers received:   Patient chooses bed at  Physician recommends and patient chooses bed at    Patient to be transferred to  on   Patient to be transferred to facility by   The following physician request were entered in Epic:   Additional Comments:   Roddie McBryant Sophia Montgomery, Porcup[place]CSWA, [place]ASA, [phone]

## 2013-06-27 DIAGNOSIS — J09X2 Influenza due to identified novel influenza A virus with other respiratory manifestations: Principal | ICD-10-CM

## 2013-06-27 MED ORDER — WHITE PETROLATUM GEL
Status: AC
  Administered 2013-06-26: 0.2
  Filled 2013-06-27: qty 5

## 2013-06-27 MED ORDER — SCOPOLAMINE 1 MG/3DAYS TD PT72: 1.0000 | MEDICATED_PATCH | TRANSDERMAL | Status: AC

## 2013-06-27 MED ORDER — ACETAMINOPHEN 650 MG RE SUPP: 650.0000 mg | Freq: Four times a day (QID) | RECTAL | Status: AC | PRN

## 2013-06-27 MED ORDER — DIAZEPAM 1 MG/ML PO SOLN: 5.0000 mg | Freq: Two times a day (BID) | ORAL | Status: AC

## 2013-06-27 MED ORDER — ALBUTEROL SULFATE (2.5 MG/3ML) 0.083% IN NEBU: 2.5000 mg | INHALATION_SOLUTION | RESPIRATORY_TRACT | Status: AC | PRN

## 2013-06-27 MED ORDER — ACETAMINOPHEN 325 MG PO TABS: 650.0000 mg | ORAL_TABLET | Freq: Four times a day (QID) | ORAL | Status: AC | PRN

## 2013-06-27 NOTE — Clinical Social Work Note (Signed)
[facility] does not have bed availability today. Admission coordinator states that they will likely have a bed tomorrow.  Sophia Montgomery Reiner Loewen, PalmerLCSWA, [place]AS[place], [phone]

## 2013-06-27 NOTE — Progress Notes (Signed)
[facility] PROGRESS NOTE  Sophia Montgomery XBJ:[id number] DOB: [birth year] DOA: 06/23/2013 PCP: Florentina Jenny, MD  Assessment/Plan: 80 year old female with severe dementia, hypertension, who was sent from the facility with 2 day history of cough with congestion with fever of 101F at the facility. Her oxygen saturation on room air was 87% and improved to 94% on 2 L via nasal cannula.  At baseline the patient is nonverbal and severely demented.  In the ED patient was found to have a temperature of 101.16F.  A chest x-ray done was negative for any infiltrate. Patient on exam had severe rhonchi bilaterally. Blood work done showed creatinine of 1.38. Prior hospitalist consulted for admission for fever with possible flulike symptoms.   Influenza A with acute hypoxic respiratory failure. PCR is positive.  Patient was started on Tami-flu on admission. Unfortunately patient has begun pocketing her food and medications.  Supportive care.  Patient was not on oxygen prior to admission.  Now requiring 2L or she drops to 80% sats. Levaquin discontinued. Secretions and rhonchi have improved significantly with scopolamine patch.  Elevated Creatinine (1.49) Baseline not known.   Creatinine decreased with IVF.  Labs are no longer being checked as patient is comfort care.  Hypertension BP stable off medications at this point still with moderate control Will add prn hydralazine. Will decrease frequency of vitals.  Alzheimer's dementia Will discontinue aricept as the patient's dementia is so advanced.  Hypothyroidism. Continue Synthroid.  Concern for aspiration Speech has evaluated the patient and started her on a Dys 2 diet. Will continue with comfort feedings.  Palliative Medicine Spoke with daughter in law, Adeana Grilliot, who agrees patient should be made comfortable and should not ever have to return to the hospital.  Greatly appreciate Palliative Medicine consultation completed 1/28.  Dr.  Phillips Odor recommends residential hospice in [facility] vs G[facility].  Severe malnutrition of chronic disease      DVT Prophylaxis:  heparin  Code Status: DNR Family Communication:  Disposition Plan: inpatient.   residential hospice in [facility] vs G[facility].      HPI/Subjective: Patient is non-verbal and does not wake on my exam.   Objective: Filed Vitals:   06/26/13 2040 06/27/13 0606 06/27/13 0634 06/27/13 0826  BP: 114/64  158/44   Pulse: 96 81    Temp: 98.4 F (36.9 C) 98.8 F (37.1 C)    TempSrc: Oral Oral    Resp: 20 18    Weight:      SpO2: 89% 95%  98%    Intake/Output Summary (Last 24 hours) at 06/27/13 1053 Last data filed at 06/26/13 2041  Gross per 24 hour  Intake    240 ml  Output      0 ml  Net    240 ml   Filed Weights   06/23/13 2134  Weight: 47.8 kg (105 lb 6.1 oz)    Exam: General: thin, elderly, non-verbal female, lying comfortably in bed.  Appears chronically ill.  HEENT:  PERR, EOMI, Anicteic Sclera, MMM. No pharyngeal erythema or exudates  Neck: Supple, no JVD, no masses  Cardiovascular: irregular  S1 S2 auscultated, no rubs, murmurs or gallops.   Respiratory: gurgling sounds have decreased.  Lungs sound more clear.  Patient does not deeply inspire. Abdomen: Soft, nontender, slightly distended but non-tender, + bowel sounds  Extremities: warm dry without cyanosis clubbing or edema.     Data Reviewed: Basic Metabolic Panel:  Recent Labs Lab 06/23/13 1830 06/24/13 0510 06/25/13 0659  NA 140 141 145  K 4.2 4.0 3.8  CL 102 105 109  CO2 25 20 22   GLUCOSE 122* 124* 92  BUN 22 30* 29*  CREATININE 1.38* 1.49* 1.20*  CALCIUM 9.0 8.7 8.9   CBC:  Recent Labs Lab 06/23/13 1830  WBC 8.1  HGB 12.1  HCT 35.6*  MCV 93.2  PLT 156     Recent Results (from the past 240 hour(s))  CULTURE, BLOOD (ROUTINE X 2)     Status: None   Collection Time    06/23/13  6:30 PM      Result Value Range Status   Specimen Description BLOOD LEFT ARM    Final   Special Requests BOTTLES DRAWN AEROBIC AND ANAEROBIC 5CC EA   Final   Culture  Setup Time     Final   Value: 06/24/2013 01:15     Performed at [facility]   Culture     Final   Value:        BLOOD CULTURE RECEIVED NO GROWTH TO DATE CULTURE WILL BE HELD FOR 5 DAYS BEFORE ISSUING A FINAL NEGATIVE REPORT     Performed at [facility]   Report Status PENDING   Incomplete  URINE CULTURE     Status: None   Collection Time    06/23/13  7:31 PM      Result Value Range Status   Specimen Description URINE, CATHETERIZED   Final   Special Requests Normal   Final   Culture  Setup Time     Final   Value: 06/24/2013 01:22     Performed at [facility] Count     Final   Value: NO GROWTH     Performed at [facility]   Culture     Final   Value: NO GROWTH     Performed at [facility]   Report Status 06/24/2013 FINAL   Final  CULTURE, BLOOD (ROUTINE X 2)     Status: None   Collection Time    06/23/13  7:38 PM      Result Value Range Status   Specimen Description BLOOD RIGHT ARM   Final   Special Requests BOTTLES DRAWN AEROBIC ONLY 8CC   Final   Culture  Setup Time     Final   Value: 06/24/2013 01:15     Performed at [facility]   Culture     Final   Value:        BLOOD CULTURE RECEIVED NO GROWTH TO DATE CULTURE WILL BE HELD FOR 5 DAYS BEFORE ISSUING A FINAL NEGATIVE REPORT     Performed at [facility]   Report Status PENDING   Incomplete  MRSA PCR SCREENING     Status: None   Collection Time    06/23/13 10:15 PM      Result Value Range Status   MRSA by PCR NEGATIVE  NEGATIVE Final   Comment:            The GeneXpert MRSA Assay (FDA     approved for NASAL specimens     only), is one component of a     comprehensive MRSA colonization     surveillance program. It is not     intended to diagnose MRSA     infection nor to guide or     monitor treatment for     MRSA infections.     Studies: No results  found.  Scheduled Meds: . albuterol  2.5 mg Nebulization TID  . artificial tears   Both Eyes QHS  . aspirin  81 mg Oral Daily  . diazepam  5 mg Oral BID  . feeding supplement (ENSURE COMPLETE)  237 mL Oral BID BM  . loteprednol  1 drop Both Eyes BH-q7a  . polyethylene glycol  17 g Oral Daily  . scopolamine  1 patch Transdermal Q72H   Continuous Infusions:    Principal Problem:   Flu-like symptoms Active Problems:   Hypoxia   Hypertension   Alzheimer's dementia   AKI (acute kidney injury)   Acute bronchitis   Influenza due to identified novel influenza A virus with other respiratory manifestations   Elevated serum creatinine    Conley CanalYork, Marianne L, PA-C  [facility] Pager [phone]. If 7PM-7AM, please contact night-coverage at [email or web address], password [email or web address] 06/27/2013, 10:53 AM  LOS: 4 days   Attending Patient seen and examined, agree with the above assessment and plan. Patient seen by palliative care yesterday, plans are for discharge to a residential hospice. Continue with above measures.  Windell NorfolkS Ghimire MD

## 2013-06-27 NOTE — Progress Notes (Signed)
Patient needs Hospice Facility level care. She is not appropriate for SNF. Her son lives in [place] just a few miles from [facility]. Will ask CSW to send referral to [facility]-no need send referrals out to other facilities due to the sensitive nature of EOL care and this family's need to be close to her facility. Await [facility] eligibility determination and bed availability-if no open beds may consider/suggest [facility] GIP admission until they can accept patient if they are willing and feel she is appropriate.  Sophia MaltaElizabeth Aliany Fiorenza, DO Palliative Medicine

## 2013-06-27 NOTE — Progress Notes (Deleted)
Palliative Medicine Team at [facility]  Date: 06/27/2013   Patient Name: [facility]  DOB: 07/24/1914  MRN: [id number]  Age / Sex: 80 y.o., female   PCP: Henry Tripp, MD Referring Physician: Mutaz Elmahi, MD  HPI/Reason for Consultation: 80 yo with multiple chronic medical problems including Alzheimer's dementia, hypertension, hypothyroidism adrenal insufficiency he was admitted to [facility] on 1/25 for altered mental status and respiratory failure. She was a resident at [facility] in the memory care unit for the past 5 years, ambulatory with a walker but had several recent falls in the past few months with decreased PO intake and weight loss at facility. Last week she developed upper repiratory symptoms and lethargy and fell at her facility-she was taken to [facility] ED and was later the same day released with according to facility and her son "no admit- able condition". She continued to deteriorate and the SNF called EMS to take her back to [facility] for which they refused to take her in transfer so she was brought to [place] ED where she had severe hypoxia, respirtory failure and tested Influenza A positive. She has received treatment for flu and supportive care but continues to decline. Minimal PO intake and worsening metal status prompted a PMT consultation.  Participants in Discussion: Met with Patient's son Jerry Chenier, he lives in [place]- he had a difficult time finding the hospital and says he rarely come to [place] but knew he had to make it here somehow. Jerry is an only son, she had another son who died tragically in an MVA back in the 1980's at the age of 38-this was devastating for her mentally according to Jerry.  Goals/Summary of Discussion: [name]'s son Jerry wants comfort, dignity and quality of life for his mothers remaining days. He shared with me a long history of suffering with progressive and advanced dementia-  he still harbors feelings of guilt for having to put her in a memory care unit but says he has done the best he could for his mother. He visits her every Sunday at her facility, she has not recognized him in years. He has noticed over the past few months she has had a significant decline. He says that he would like her to have a peaceful and natural death without aggressive medical interventions and full comfort measures put in place-he does not want any life-prolonging interventions given her advanced dementia and current quality of life. He also does not feel like [facility] can handle her in her current condition and he really does not want her last days to be spent there. He expresses appreciation for the palliative approach and feels relieved that comfort is an option-he felt like he didn't have options about taking her to the hospital etc..  1. Code Status:  DNR  2. Scope of Treatment:  Full Comfort  No medical interventions other than those directly related to her comfort and QOL.  Comfort Feeding   No rehospitalization  No antibiotics or Ivs unless needed for comfort medications  No IV hydration or artifical feeding  3. Assessment/Plan:  Primary Diagnoses  1. Acute respiratory failure associated with influenza A viral infection-high risk for post-flu PNA/complications 2. Malnutrition, failure to thrive-No Albumin labs-will see if I can add on to prior blood work-no additional sticks 3. Alzheimer's dementia 4. Acute kidney injury/dehydration 5. Severe Presbycusis 6. Dysphasia, poor appetite high aspiration risk 7. Altered mental status, periods of being minimally responsive and periods   of agitation  Prognosis: Patient has had little to no PO intake since admission, pocketing food, not swallowing with maximal cueing. <2 weeks   PPS 30%   Active Symptoms 1. Agitation, Delirium 2. Cough and Dyspnea associated with Flu  Recommendations:   Started her on Roxanol 5mg q2  hours prn dyspnea and distress  Scheduled Concentrated SL Valium 5mg BID with prn for breakthrough agitation (had been on benzos previously for many years)  Discontinued all non-comfort related meds- stopped dextromethorphan (DM), aricept, BP meds   4. Palliative Prophylaxis:   Bowel Regimen- yes  Terminal Secretions-scop patch   Breakthrough Pain and Dyspnea-roxanol   Agitation and Delirium-valium  Nausea-zofran  5. Psychosocial Spiritual Asssessment/Interventions:  Patient and Family Adjustment to Illness/Prognosis: Coping well  Spiritual Concerns or Needs: none expressed  6. Disposition: [name] is appropriate for a hospice facility based on stated goals of care and her progressive decline following acute influenza respiratory failure- she is now bedbound, not eating, agitated when awake, probable dyspnea. Her BP is soft and her sats are only in the upper 80's.   ROS:  Unable to obtain Social History:  Originally from [place],  where she lived until 5 years ago when she went into memory care. She is widowed-her husband died in the 1990's.  reports that she has never smoked. She does not have any smokeless tobacco history on file. Living Situation:[facility] Occupation: Worked in [employer]  Family History: No family history on file.  Active Medications:  Outpatient medications: Prescriptions prior to admission  Medication Sig Dispense Refill  . acetaminophen (TYLENOL) 325 MG tablet Take 325 mg by mouth every 4 (four) hours as needed for mild pain.      . Artificial Tear Ointment (REFRESH LACRI-LUBE) OINT Apply 1 application to eye at bedtime.      . aspirin 81 MG chewable tablet Chew 81 mg by mouth daily.      . docusate (COLACE) 50 MG/5ML liquid Take 100 mg by mouth daily.      . donepezil (ARICEPT) 5 MG tablet Take 5 mg by mouth at bedtime.      . furosemide (LASIX) 40 MG tablet Take 40 mg by mouth daily.      . levofloxacin  (LEVAQUIN) 250 MG tablet Take 500 mg by mouth daily. X 10 days      . levothyroxine (SYNTHROID, LEVOTHROID) 25 MCG tablet Take 25 mcg by mouth daily before breakfast.      . lisinopril (PRINIVIL,ZESTRIL) 5 MG tablet Take 5 mg by mouth daily.      . loteprednol (LOTEMAX) 0.5 % ophthalmic suspension Place 1 drop into both eyes every morning.      . Multiple Vitamins-Minerals (MULTIVITAMINS THER. W/MINERALS) TABS tablet Take by mouth daily.      . polyethylene glycol (MIRALAX / GLYCOLAX) packet Take 17 g by mouth daily.      . potassium chloride 20 MEQ/15ML (10%) solution Take 7.5 mEq by mouth 2 (two) times daily.      . sulfamethoxazole-trimethoprim (BACTRIM DS) 800-160 MG per tablet Take 1 tablet by mouth 2 (two) times daily.        Current medications: Infusions:    Scheduled Medications: . albuterol  2.5 mg Nebulization TID  . artificial tears   Both Eyes QHS  . aspirin  81 mg Oral Daily  . dextromethorphan-guaiFENesin  1 tablet Oral BID  . diazepam  5 mg Oral BID  . feeding supplement (ENSURE COMPLETE)  237 mL   Oral BID BM  . loteprednol  1 drop Both Eyes BH-q7a  . oseltamivir  30 mg Oral Daily  . polyethylene glycol  17 g Oral Daily  . scopolamine  1 patch Transdermal Q72H    PRN Medications: acetaminophen, acetaminophen, albuterol, morphine injection, ondansetron (ZOFRAN) IV, ondansetron   Vital Signs: BP 114/64  Pulse 96  Temp(Src) 98.4 F (36.9 C) (Oral)  Resp 20  Wt 47.8 kg (105 lb 6.1 oz)  SpO2 89%   Physical Exam:  Frail disheveled severely HOH elderly woman-she awakes only to vigorous stimulation and verbal cues. Very confused, agitated upon awakening, diffuse crackles on ausculation, Irregular HR+SEM, soft abdomen, +echmoses, +edema Labs:  Basic or Comprehensive Metabolic Panel:    Component Value Date/Time   NA 145 06/25/2013 0659   K 3.8 06/25/2013 0659   CL 109 06/25/2013 0659   CO2 22 06/25/2013 0659   BUN 29* 06/25/2013 0659   CREATININE 1.20* 06/25/2013  0659   GLUCOSE 92 06/25/2013 0659   CALCIUM 8.9 06/25/2013 0659     CBC:    Component Value Date/Time   WBC 8.1 06/23/2013 1830   HGB 12.1 06/23/2013 1830   HCT 35.6* 06/23/2013 1830   PLT 156 06/23/2013 1830   MCV 93.2 06/23/2013 1830    Time: 70 minutes. Greater than 50%  of this time was spent counseling and coordinating care related to the above assessment and plan.  Signed by: Ariany Kesselman L Wilmetta Speiser, DO  06/27/2013, 12:19 AM  Please contact Palliative Medicine Team phone at [phone] for questions and concerns. 

## 2013-06-27 NOTE — Consult Note (Signed)
Palliative Medicine Team at [facility]  Date: 06/27/2013   Patient Name: [facility]ia [facility]  DOB: 1915/02/21  MRN: [id number]  Age / Sex: 80 y.o., female   PCP: Sophia Poll, MD Referring Physician: Verlee Monte, MD  HPI/Reason for Consultation: 80 yo with multiple chronic medical problems including Alzheimer's dementia, hypertension, hypothyroidism adrenal insufficiency he was [facility] for altered mental status and respiratory failure. She [facility] a [facility] in [facility] unit for the past 5 years, ambulatory with a walker but had several recent falls in the past few months with decreased PO intake and weight loss at facility. Last week she developed upper repiratory symptoms and lethargy and fell at her facility-she was taken to [facility] and was later the same day released with according to facility and her son "no admit- able condition". She continued to deteriorate and the SNF called EMS to take [facility] back to [facility] for which they refused to take her in transfer so she was brought to [facility] where she had severe hypoxia, respirtory failure and tested Influenza A positive. She has received treatment for flu and supportive care but continues to decline. Minimal PO intake and worsening metal status prompted a PMT consultation.  Participants in Discussion: Met with Patient's son Sophia [facility], he lives in [place]- he had a difficult time finding the [facility] and says he rarely come to [place] but knew he had to make it here somehow. Sophia [facility] is an only son, she had another son who died tragically in an MVA back in the 1980's at the age of 38-this was devastating for her mentally according to Sophia [facility].  Goals/Summary of Discussion: [facility]'s son Sophia [facility] wants comfort, dignity and quality of life for his mothers remaining days. He shared with me a long history of suffering with progressive and advanced dementia-  he still harbors feelings of guilt for having to put her in a memory care unit but says he has done the best he could for his mother. He visits her every Sunday at her facility, she has not recognized him in years. He has noticed over the past few months she has had a significant decline. He says that he would like her to have a peaceful and natural death without aggressive medical interventions and full comfort measures put in place-he does not want any life-prolonging interventions given her advanced dementia and current quality of life. He also does not feel like [facility] can handle her in her current condition and he really does not want her last days to be spent there. He expresses appreciation for the palliative approach and feels relieved that comfort is an option-he felt like he didn't have options about taking her to the [facility] etc..  1. Code Status:  DNR  2. Scope of Treatment:  Full Comfort  No medical interventions other than those directly related to her comfort and QOL.  Comfort Feeding   No rehospitalization  No antibiotics or Ivs unless needed for comfort medications  No IV hydration or artifical feeding  3. Assessment/Plan:  Primary Diagnoses  1. Acute respiratory failure associated with influenza A viral infection-high risk for post-flu PNA/complications 2. Malnutrition, failure to thrive-No Albumin labs-will see if I can add on to prior blood work-no additional sticks 3. Alzheimer's dementia 4. Acute kidney injury/dehydration 5. Severe Presbycusis 6. Dysphasia, poor appetite high aspiration risk 7. Altered mental status, periods of being minimally responsive and periods  of agitation  Prognosis: Patient has had little to no PO intake since admission, pocketing food, not swallowing with maximal cueing. <2 weeks   PPS 30%   Active Symptoms 1. Agitation, Delirium 2. Cough and Dyspnea associated with Flu  Recommendations:   Started her on Roxanol 64m q2  hours prn dyspnea and distress  Scheduled Concentrated SL Valium 552mBID with prn for breakthrough agitation (had been on benzos previously for many years)  Discontinued all non-comfort related meds- stopped dextromethorphan (DM), aricept, BP meds   4. Palliative Prophylaxis:   Bowel Regimen- yes  Terminal Secretions-scop patch   Breakthrough Pain and Dyspnea-roxanol   Agitation and Delirium-valium  Nausea-zofran  5. Psychosocial Spiritual Asssessment/Interventions:  Patient and Family Adjustment to Illness/Prognosis: Coping well  Spiritual Concerns or Needs: none expressed  6. Disposition: [name] appropriate for a hospice facility based on stated goals of care and her progressive decline following acute influenza respiratory failure- she is now bedbound, not eating, agitated when awake, probable dyspnea. Her BP is soft and her sats are only in the upper 80's.   ROS:  Unable to obtain Social History:  [place] when she went into memory care. She is widowed-her husband died in the 1990's.  reports that she has never smoked. She does not have any smokeless tobacco history on file. [facility] Occupation: Worked in [employer] History: No family history on file.  Active Medications:  Outpatient medications: Prescriptions prior to admission  Medication Sig Dispense Refill  . acetaminophen (TYLENOL) 325 MG tablet Take 325 mg by mouth every 4 (four) hours as needed for mild pain.      . Artificial Tear Ointment (REFRESH LACRI-LUBE) OINT Apply 1 application to eye at bedtime.      . Marland Kitchenspirin 81 MG chewable tablet Chew 81 mg by mouth daily.      . Marland Kitchenocusate (COLACE) 50 MG/5ML liquid Take 100 mg by mouth daily.      . Marland Kitchenonepezil (ARICEPT) 5 MG tablet Take 5 mg by mouth at bedtime.      . furosemide (LASIX) 40 MG tablet Take 40 mg by mouth daily.      . Marland Kitchenevofloxacin  (LEVAQUIN) 250 MG tablet Take 500 mg by mouth daily. X 10 days      . levothyroxine (SYNTHROID, LEVOTHROID) 25 MCG tablet Take 25 mcg by mouth daily before breakfast.      . lisinopril (PRINIVIL,ZESTRIL) 5 MG tablet Take 5 mg by mouth daily.      . Marland Kitchenoteprednol (LOTEMAX) 0.5 % ophthalmic suspension Place 1 drop into both eyes every morning.      . Multiple Vitamins-Minerals (MULTIVITAMINS THER. W/MINERALS) TABS tablet Take by mouth daily.      . polyethylene glycol (MIRALAX / GLYCOLAX) packet Take 17 g by mouth daily.      . potassium chloride 20 MEQ/15ML (10%) solution Take 7.5 mEq by mouth 2 (two) times daily.      . Marland Kitchenulfamethoxazole-trimethoprim (BACTRIM DS) 800-160 MG per tablet Take 1 tablet by mouth 2 (two) times daily.        Current medications: Infusions:    Scheduled Medications: . albuterol  2.5 mg Nebulization TID  . artificial tears   Both Eyes QHS  . aspirin  81 mg Oral Daily  . dextromethorphan-guaiFENesin  1 tablet Oral BID  . diazepam  5 mg Oral BID  . feeding supplement (ENSURE COMPLETE)  237 mL  Oral BID BM  . loteprednol  1 drop Both Eyes BH-q7a  . oseltamivir  30 mg Oral Daily  . polyethylene glycol  17 g Oral Daily  . scopolamine  1 patch Transdermal Q72H    PRN Medications: acetaminophen, acetaminophen, albuterol, morphine injection, ondansetron (ZOFRAN) IV, ondansetron   Vital Signs: BP 114/64  Pulse 96  Temp(Src) 98.4 F (36.9 C) (Oral)  Resp 20  Wt 47.8 kg (105 lb 6.1 oz)  SpO2 89%   Physical Exam:  Frail disheveled severely HOH elderly woman-she awakes only to vigorous stimulation and verbal cues. Very confused, agitated upon awakening, diffuse crackles on ausculation, Irregular HR+SEM, soft abdomen, +echmoses, +edema Labs:  Basic or Comprehensive Metabolic Panel:    Component Value Date/Time   NA 145 06/25/2013 0659   K 3.8 06/25/2013 0659   CL 109 06/25/2013 0659   CO2 22 06/25/2013 0659   BUN 29* 06/25/2013 0659   CREATININE 1.20* 06/25/2013  0659   GLUCOSE 92 06/25/2013 0659   CALCIUM 8.9 06/25/2013 0659     CBC:    Component Value Date/Time   WBC 8.1 06/23/2013 1830   HGB 12.1 06/23/2013 1830   HCT 35.6* 06/23/2013 1830   PLT 156 06/23/2013 1830   MCV 93.2 06/23/2013 1830    Time: 70 minutes. Greater than 50%  of this time was spent counseling and coordinating care related to the above assessment and plan.  Signed by: Acquanetta Chain, DO  06/27/2013, 12:19 AM  Please contact Palliative Medicine Team phone at [phone] for questions and concerns.

## 2013-06-27 NOTE — Clinical Social Work Note (Signed)
CS[facility] She states that son is coming over tomorrow at 3:00 PM to complete paperwork. She will contact CSW in morning regarding bed availability.  Roddie McBryant Maxine Huynh, AmoryLCSWA, [place]A, [phone]

## 2013-06-27 NOTE — Discharge Summary (Signed)
Physician Discharge Summary  Sophia Montgomery AVW:[id number] DOB: 02/13/1915 DOA: 06/23/2013  PCP: Florentina Jenny, MD  Admit date: 06/23/2013 Discharge date: 06/27/2013  Time spent: 45 minutes  Recommendations for Outpatient Follow-up:  Comfort Care Only.  Patient does not need to return to the hospital. [facility] in [place] [facility] [place], [place]  Discharge Diagnoses:  Principal Problem:   Flu-like symptoms Active Problems:   Hypoxia   Hypertension   Alzheimer's dementia   AKI (acute kidney injury)   Acute bronchitis   Influenza due to identified novel influenza A virus with other respiratory manifestations   Elevated serum creatinine   Discharge Condition: chronically ill, demented, non-verbal, very poor prognosis  Diet recommendation: comfort feedings.  Filed Weights   06/23/13 2134  Weight: 47.8 kg (105 lb 6.1 oz)    History of present illness at the time of admission:  80 year old female with severe dementia, hypertension, who was sent from the facility with 2 day history of cough with congestion with fever of 101F at the facility. Her oxygen saturation on room air was 87% and improved to 94% on 2 L via nasal cannula. At baseline the patient is nonverbal and severely demented. In the ED patient was found to have a temperature of 101.27F. A chest x-ray done was negative for any infiltrate. Patient on exam had severe rhonchi bilaterally. Blood work done showed creatinine of 1.38. Prior hospitalist consulted for admission for fever with possible flulike symptoms.    Hospital Course:  Influenza A with acute hypoxic respiratory failure.  PCR is positive. Patient was started on Tami-flu on admission.  Unfortunately patient began pocketing her food and medications.  Supportive care.  Patient was not on oxygen prior to admission. Now requiring 2L or she drops to 80% sats.  Levaquin was started initially, but discontinued when Influenza PCR was positive. Secretions and rhonchi have  improved significantly with scopolamine patch.   Elevated Creatinine (1.49)  Baseline not known. Likely has an element of chronic kidney disease. Creatinine decreased with IVF.   IVF have been discontinued. Labs are no longer being checked as patient is comfort care.   Hypertension  BP stable off medications.   Alzheimer's dementia  Will discontinue aricept as the patient's dementia is so advanced.   Hypothyroidism.  Will discontinue synthroid at residential hospice.  Concern for aspiration  Speech has evaluated the patient and started her on a Dys 2 diet.  Will continue with comfort feedings.   Palliative Medicine  Spoke with daughter in law, Aubriella Perezgarcia, who agrees patient should be made comfortable and should not ever have to return to the hospital. Greatly appreciate Palliative Medicine consultation completed 1/28. Dr. Phillips Odor recommends residential hospice in [facility]   Consultations:  Palliative Medicine  Discharge Exam: Filed Vitals:   06/27/13 1316  BP: 139/71  Pulse: 71  Temp: 97.6 F (36.4 C)  Resp: 20   General: thin, elderly, non-verbal female, lying comfortably in bed. Appears chronically ill.  Does not open her eyes to my exam. HEENT: PERR, EOMI, Anicteic Sclera, MMM. No pharyngeal erythema or exudates  Neck: Supple, no JVD, no masses  Cardiovascular: irregular S1 S2 auscultated, no rubs, murmurs or gallops.  Respiratory: CTA. Patient does not deeply inspire.  Abdomen: cachectic, Soft, nontender, slightly distended but non-tender, + bowel sounds  Extremities: warm dry without cyanosis clubbing or edema.     Discharge Instructions      Discharge Orders   Future Orders Complete By Expires   Activity as tolerated -  No restrictions  As directed    Comments:     Bed rest   Diet general  As directed    Comments:     Comfort feedings.       Medication List    STOP taking these medications       aspirin 81 MG chewable tablet      donepezil 5 MG tablet  Commonly known as:  ARICEPT     furosemide 40 MG tablet  Commonly known as:  LASIX     levofloxacin 250 MG tablet  Commonly known as:  LEVAQUIN     levothyroxine 25 MCG tablet  Commonly known as:  SYNTHROID, LEVOTHROID     lisinopril 5 MG tablet  Commonly known as:  PRINIVIL,ZESTRIL     multivitamins ther. w/minerals Tabs tablet     polyethylene glycol packet  Commonly known as:  MIRALAX / GLYCOLAX     potassium chloride 20 MEQ/15ML (10%) solution     sulfamethoxazole-trimethoprim 800-160 MG per tablet  Commonly known as:  BACTRIM DS      TAKE these medications       acetaminophen 325 MG tablet  Commonly known as:  TYLENOL  Take 2 tablets (650 mg total) by mouth every 6 (six) hours as needed for mild pain (or Fever >/= 101).     acetaminophen 650 MG suppository  Commonly known as:  TYLENOL  Place 1 suppository (650 mg total) rectally every 6 (six) hours as needed for mild pain (or Fever >/= 101).     albuterol (2.5 MG/3ML) 0.083% nebulizer solution  Commonly known as:  PROVENTIL  Take 3 mLs (2.5 mg total) by nebulization every 2 (two) hours as needed for wheezing.     diazepam 1 MG/ML solution  Commonly known as:  VALIUM  Take 5 mLs (5 mg total) by mouth 2 (two) times daily.     docusate 50 MG/5ML liquid  Commonly known as:  COLACE  Take 100 mg by mouth daily.     loteprednol 0.5 % ophthalmic suspension  Commonly known as:  LOTEMAX  Place 1 drop into both eyes every morning.     REFRESH LACRI-LUBE Oint  Apply 1 application to eye at bedtime.     scopolamine 1.5 MG  Commonly known as:  TRANSDERM-SCOP  Place 1 patch (1.5 mg total) onto the skin every 3 (three) days.       No Known Allergies    The results of significant diagnostics from this hospitalization (including imaging, microbiology, ancillary and laboratory) are listed below for reference.    Significant Diagnostic Studies: Dg Chest 2 View  06/23/2013   CLINICAL DATA:   Fever and cough  EXAM: CHEST  2 VIEW  COMPARISON:  DG CHEST 2V dated 06/23/2013  FINDINGS: Chronic central bronchial wall thickening is noted with diffuse mild prominence of the interstitial markings but no focal pulmonary opacity. The aorta is unfolded and ectatic. Mild enlargement of the cardiomediastinal silhouette is reidentified. Central thoracic vertebral compression fracture deformity is stable. No pleural effusion.  IMPRESSION: Stable chronic findings as above.   Electronically Signed   By: Christiana Pellant M.D.   On: 06/23/2013 18:40    Microbiology: Recent Results (from the past 240 hour(s))  CULTURE, BLOOD (ROUTINE X 2)     Status: None   Collection Time    06/23/13  6:30 PM      Result Value Range Status   Specimen Description BLOOD LEFT ARM   Final   Special  Requests BOTTLES DRAWN AEROBIC AND ANAEROBIC 5CC EA   Final   Culture  Setup Time     Final   Value: 06/24/2013 01:15     Performed at [facility]   Culture     Final   Value:        BLOOD CULTURE RECEIVED NO GROWTH TO DATE CULTURE WILL BE HELD FOR 5 DAYS BEFORE ISSUING A FINAL NEGATIVE REPORT     Performed at [facility]   Report Status PENDING   Incomplete  URINE CULTURE     Status: None   Collection Time    06/23/13  7:31 PM      Result Value Range Status   Specimen Description URINE, CATHETERIZED   Final   Special Requests Normal   Final   Culture  Setup Time     Final   Value: 06/24/2013 01:22     Performed at [facility]   Colony Count     Final   Value: NO GROWTH     Performed at [facility]   Culture     Final   Value: NO GROWTH     Performed at [facility]   Report Status 06/24/2013 FINAL   Final  CULTURE, BLOOD (ROUTINE X 2)     Status: None   Collection Time    06/23/13  7:38 PM      Result Value Range Status   Specimen Description BLOOD RIGHT ARM   Final   Special Requests BOTTLES DRAWN AEROBIC ONLY 8CC   Final   Culture  Setup Time     Final   Value:  06/24/2013 01:15     Performed at [facility]   Culture     Final   Value:        BLOOD CULTURE RECEIVED NO GROWTH TO DATE CULTURE WILL BE HELD FOR 5 DAYS BEFORE ISSUING A FINAL NEGATIVE REPORT     Performed at [facility]   Report Status PENDING   Incomplete  MRSA PCR SCREENING     Status: None   Collection Time    06/23/13 10:15 PM      Result Value Range Status   MRSA by PCR NEGATIVE  NEGATIVE Final   Comment:            The GeneXpert MRSA Assay (FDA     approved for NASAL specimens     only), is one component of a     comprehensive MRSA colonization     surveillance program. It is not     intended to diagnose MRSA     infection nor to guide or     monitor treatment for     MRSA infections.     Labs: Basic Metabolic Panel:  Recent Labs Lab 06/23/13 1830 06/24/13 0510 06/25/13 0659  NA 140 141 145  K 4.2 4.0 3.8  CL 102 105 109  CO2 25 20 22   GLUCOSE 122* 124* 92  BUN 22 30* 29*  CREATININE 1.38* 1.49* 1.20*  CALCIUM 9.0 8.7 8.9    CBC:  Recent Labs Lab 06/23/13 1830  WBC 8.1  HGB 12.1  HCT 35.6*  MCV 93.2  PLT 156     SignedConley Canal:  York, Marianne L, PA-C [phone]  [facility] 06/27/2013, 1:46 PM  Attending Seen and examined, agree with above. Await transfer to residential hospice. Now on full comfort measures.   Windell NorfolkS Vasili Fok MD

## 2013-06-27 NOTE — Clinical Social Work Note (Signed)
Referral faxed to [facility].  Roddie McBryant Janneth Krasner, PaloLCSWA, [place], [phone]

## 2013-06-28 NOTE — Progress Notes (Signed)
Pt prepared for d/c to hospice. IV d/c'd. Skin intact except as most recently charted. Vitals are stable. Report called to RN Fannie Knee(Sue) at receiving facility. Pt to be transported by ambulance service.

## 2013-06-28 NOTE — Progress Notes (Signed)
[facility] PROGRESS NOTE  Sophia Montgomery RUE:[id number] DOB: [birth year] DOA: 06/23/2013 PCP: Florentina Jenny, MD   Sophia Montgomery appears very dehydrated.  She is sleeping when I enter the room.  After my exam she awakens and indicates that she has right sided chest pain.   She appears chronically ill and likely to pass.  Comfort care awaiting hospice placement.    Assessment/Plan: 80 year old female with severe dementia, hypertension, who was sent from the facility with 2 day history of cough with congestion with fever of 101F at the facility. Her oxygen saturation on room air was 87% and improved to 94% on 2 L via nasal cannula.  At baseline the patient is nonverbal and severely demented.  In the ED patient was found to have a temperature of 101.63F.  A chest x-ray done was negative for any infiltrate. Patient on exam had severe rhonchi bilaterally. Blood work done showed creatinine of 1.38. Prior hospitalist consulted for admission for fever with possible flulike symptoms.   Influenza A with acute hypoxic respiratory failure. PCR is positive.  Patient was started on Tami-flu on admission. Unfortunately patient has begun pocketing her food and medications.  Supportive care.  Patient was not on oxygen prior to admission.  Now requiring 2L or she drops to 80% sats. Levaquin discontinued. Secretions and rhonchi have improved significantly with scopolamine patch.  Elevated Creatinine (1.49) Baseline not known.   Creatinine decreased with IVF.  Labs are no longer being checked as patient is comfort care.  Hypertension BP stable off medications at this point still with moderate control Will add prn hydralazine. Will decrease frequency of vitals.  Alzheimer's dementia Will discontinue aricept as the patient's dementia is so advanced.  Hypothyroidism. Continue Synthroid.  Concern for aspiration Speech has evaluated the patient and started her on a Dys 2 diet. Will continue with  comfort feedings.  Palliative Medicine Spoke with daughter in law, Sophia Montgomery, who agrees patient should be made comfortable and should not ever have to return to the hospital.  Greatly appreciate Palliative Medicine consultation completed 1/28.  Dr. Phillips Odor recommends [facility] in [facility].  Severe malnutrition of chronic disease      DVT Prophylaxis:  heparin  Code Status: DNR Family Communication:  Disposition Plan: inpatient.   residential hospice in [facility] vs [facility].      HPI/Subjective: Patient is non-verbal and does not wake on my exam.   Objective: Filed Vitals:   06/27/13 2052 06/27/13 2135 06/28/13 0500 06/28/13 0931  BP:  146/63 140/70   Pulse:  81 74   Temp:  98.6 F (37 C) 98.5 F (36.9 C)   TempSrc:  Oral Axillary   Resp:  20 20   Height:      Weight:      SpO2: 92% 92% 95% 97%    Intake/Output Summary (Last 24 hours) at 06/28/13 1036 Last data filed at 06/28/13 0900  Gross per 24 hour  Intake    325 ml  Output      0 ml  Net    325 ml   Filed Weights   06/23/13 2134 06/27/13 1500  Weight: 47.8 kg (105 lb 6.1 oz) 47.628 kg (105 lb)    Exam: General: thin, elderly, non-verbal female, lying comfortably in bed.  Appears chronically ill.  HEENT:  . MM appear dry.  Neck: Supple, no JVD, no masses  Cardiovascular: irregular  S1 S2 auscultated, no rubs, murmurs or gallops.   Respiratory: gurgling sounds have  decreased.  Lungs sound more clear.  Patient does not deeply inspire. Abdomen: Cachetic. Soft, nontender, slightly distended but non-tender, + bowel sounds  Extremities: warm dry without cyanosis clubbing or edema.     Data Reviewed: Basic Metabolic Panel:  Recent Labs Lab 06/23/13 1830 06/24/13 0510 06/25/13 0659  NA 140 141 145  K 4.2 4.0 3.8  CL 102 105 109  CO2 25 20 22   GLUCOSE 122* 124* 92  BUN 22 30* 29*  CREATININE 1.38* 1.49* 1.20*  CALCIUM 9.0 8.7 8.9   CBC:  Recent Labs Lab 06/23/13 1830   WBC 8.1  HGB 12.1  HCT 35.6*  MCV 93.2  PLT 156     Recent Results (from the past 240 hour(s))  CULTURE, BLOOD (ROUTINE X 2)     Status: None   Collection Time    06/23/13  6:30 PM      Result Value Range Status   Specimen Description BLOOD LEFT ARM   Final   Special Requests BOTTLES DRAWN AEROBIC AND ANAEROBIC 5CC EA   Final   Culture  Setup Time     Final   Value: 06/24/2013 01:15     Performed at [facility]   Culture     Final   Value:        BLOOD CULTURE RECEIVED NO GROWTH TO DATE CULTURE WILL BE HELD FOR 5 DAYS BEFORE ISSUING A FINAL NEGATIVE REPORT     Performed at [facility]   Report Status PENDING   Incomplete  URINE CULTURE     Status: None   Collection Time    06/23/13  7:31 PM      Result Value Range Status   Specimen Description URINE, CATHETERIZED   Final   Special Requests Normal   Final   Culture  Setup Time     Final   Value: 06/24/2013 01:22     Performed at [facility]   Colony Count     Final   Value: NO GROWTH     Performed at [facility]   Culture     Final   Value: NO GROWTH     Performed at [facility]   Report Status 06/24/2013 FINAL   Final  CULTURE, BLOOD (ROUTINE X 2)     Status: None   Collection Time    06/23/13  7:38 PM      Result Value Range Status   Specimen Description BLOOD RIGHT ARM   Final   Special Requests BOTTLES DRAWN AEROBIC ONLY 8CC   Final   Culture  Setup Time     Final   Value: 06/24/2013 01:15     Performed at [facility]   Culture     Final   Value:        BLOOD CULTURE RECEIVED NO GROWTH TO DATE CULTURE WILL BE HELD FOR 5 DAYS BEFORE ISSUING A FINAL NEGATIVE REPORT     Performed at [facility]   Report Status PENDING   Incomplete  MRSA PCR SCREENING     Status: None   Collection Time    06/23/13 10:15 PM      Result Value Range Status   MRSA by PCR NEGATIVE  NEGATIVE Final   Comment:            The GeneXpert MRSA Assay (FDA     approved for  NASAL specimens     only), is one component of a  comprehensive MRSA colonization     surveillance program. It is not     intended to diagnose MRSA     infection nor to guide or     monitor treatment for     MRSA infections.     Studies: No results found.  Scheduled Meds: . albuterol  2.5 mg Nebulization TID  . artificial tears   Both Eyes QHS  . aspirin  81 mg Oral Daily  . diazepam  5 mg Oral BID  . feeding supplement (ENSURE COMPLETE)  237 mL Oral BID BM  . loteprednol  1 drop Both Eyes [id number]  . polyethylene glycol  17 g Oral Daily  . scopolamine  1 patch Transdermal Q72H   Continuous Infusions:    Principal Problem:   Flu-like symptoms Active Problems:   Hypoxia   Hypertension   Alzheimer's dementia   AKI (acute kidney injury)   Acute bronchitis   Influenza due to identified novel influenza A virus with other respiratory manifestations   Elevated serum creatinine    Conley Canal  [facility] Pager [phone]. If 7PM-7AM, please contact night-coverage at [email or web address], password [facility] 06/28/2013, 10:36 AM  LOS: 5 days    Addendum Seen and examined, agree with above.  S Talbot Monarch

## 2013-06-28 NOTE — Progress Notes (Signed)
Nutrition Brief Note  Chart reviewed. Pt now transitioning to comfort care.  No further nutrition interventions warranted at this time.  Please re-consult as needed.   Sandeep Radell MS, RD, LDN Pager: [phone] After-hours pager: [phone]    

## 2013-06-28 NOTE — Clinical Social Work Note (Signed)
Per MD patient ready to DC to [facility]. RN gave report. Son notified. EMS transport requested for patient. CSW signing off.   Roddie McBryant Kenner Lewan, [place]CSWA, [place], [phone]

## 2013-06-30 LAB — CULTURE, BLOOD (ROUTINE X 2)
CULTURE: NO GROWTH
Culture: NO GROWTH

## 2013-07-01 ENCOUNTER — Encounter: Payer: Self-pay | Admitting: Emergency Medicine

## 2013-07-28 DEATH — deceased

## 2015-07-18 IMAGING — CR DG CHEST 2V
2 series · 2 of 2 positions shown · non-contrast
Comparison: DG CHEST 2V dated 06/23/2013

CLINICAL DATA: Fever and cough

EXAM:
CHEST  2 VIEW

[x chest ap]
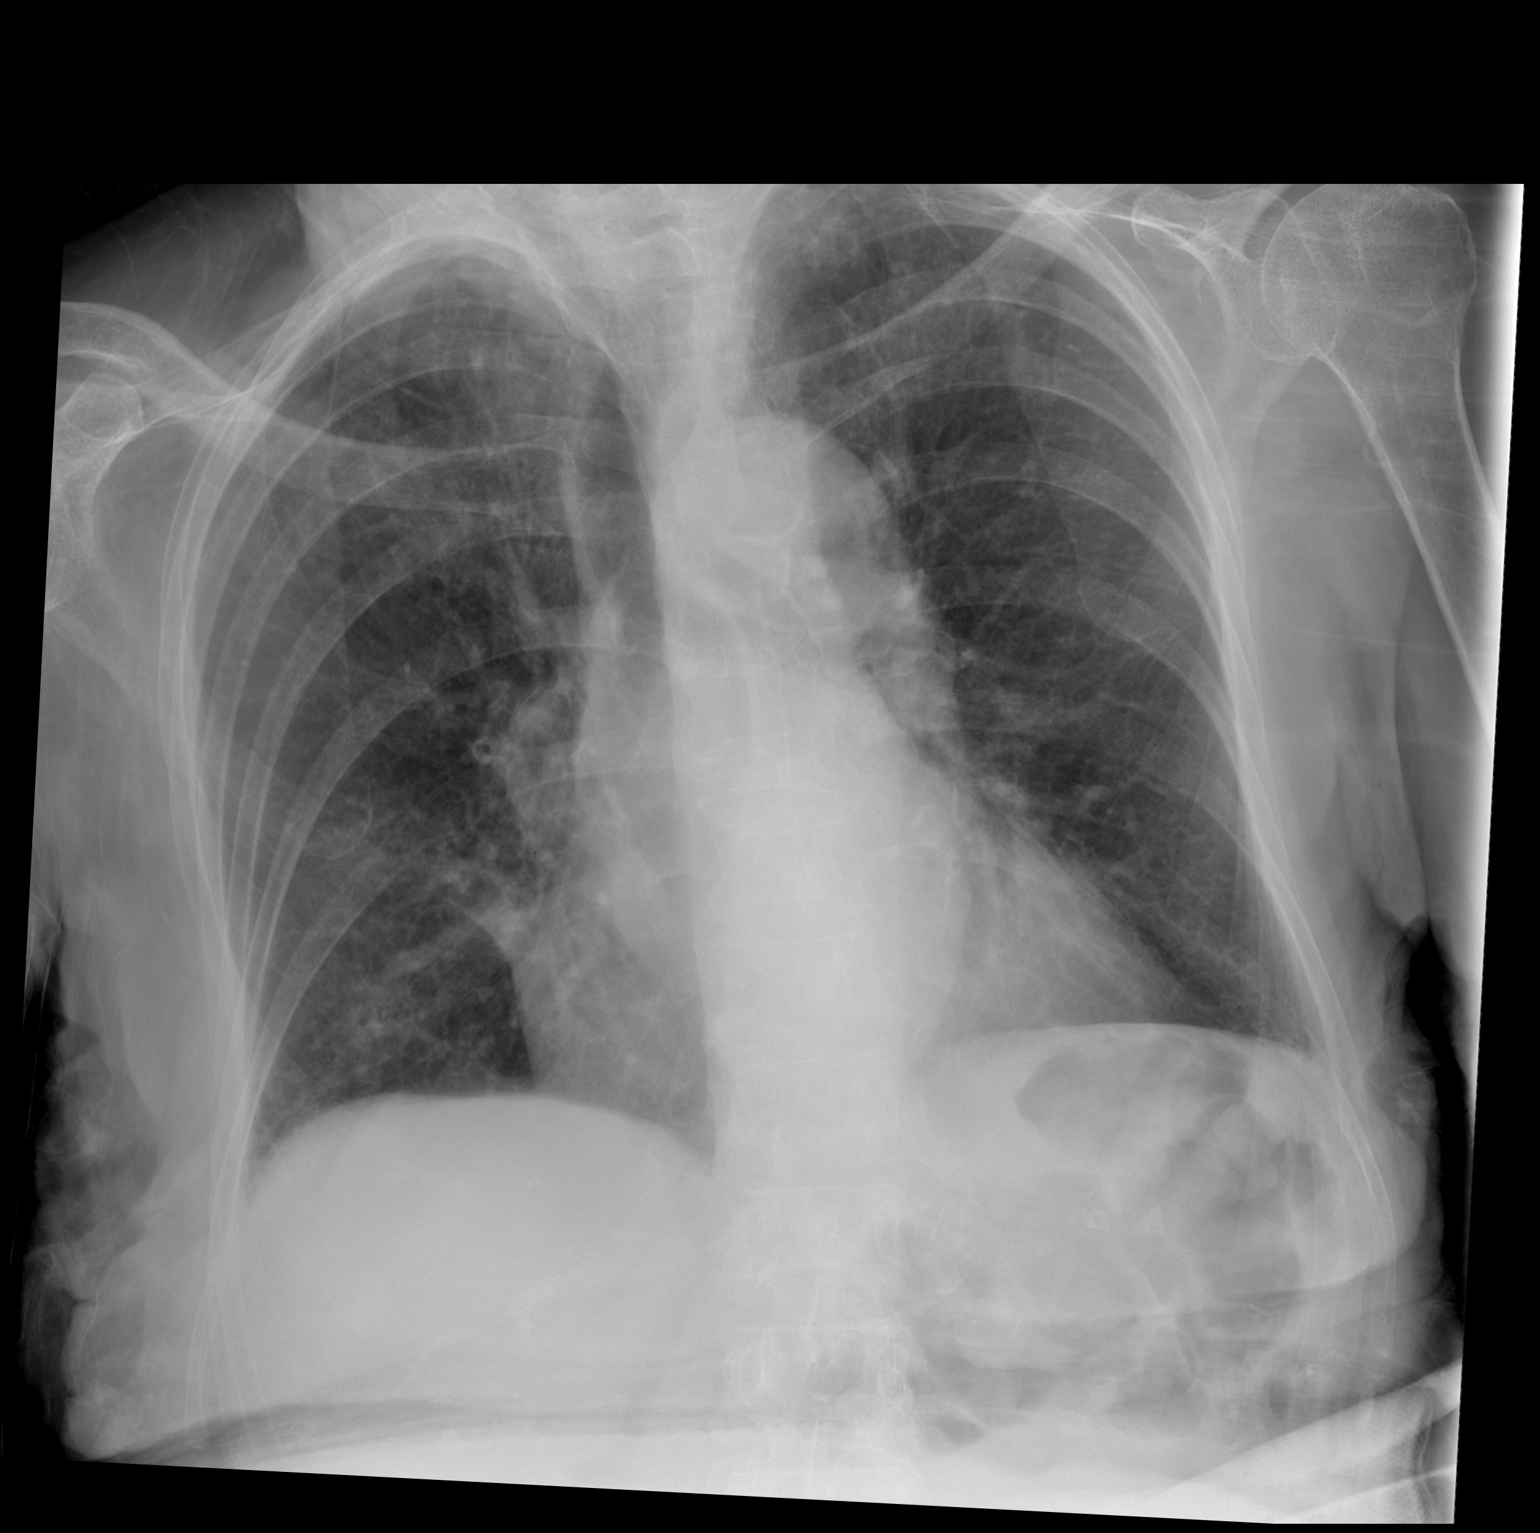

[w chest lat]
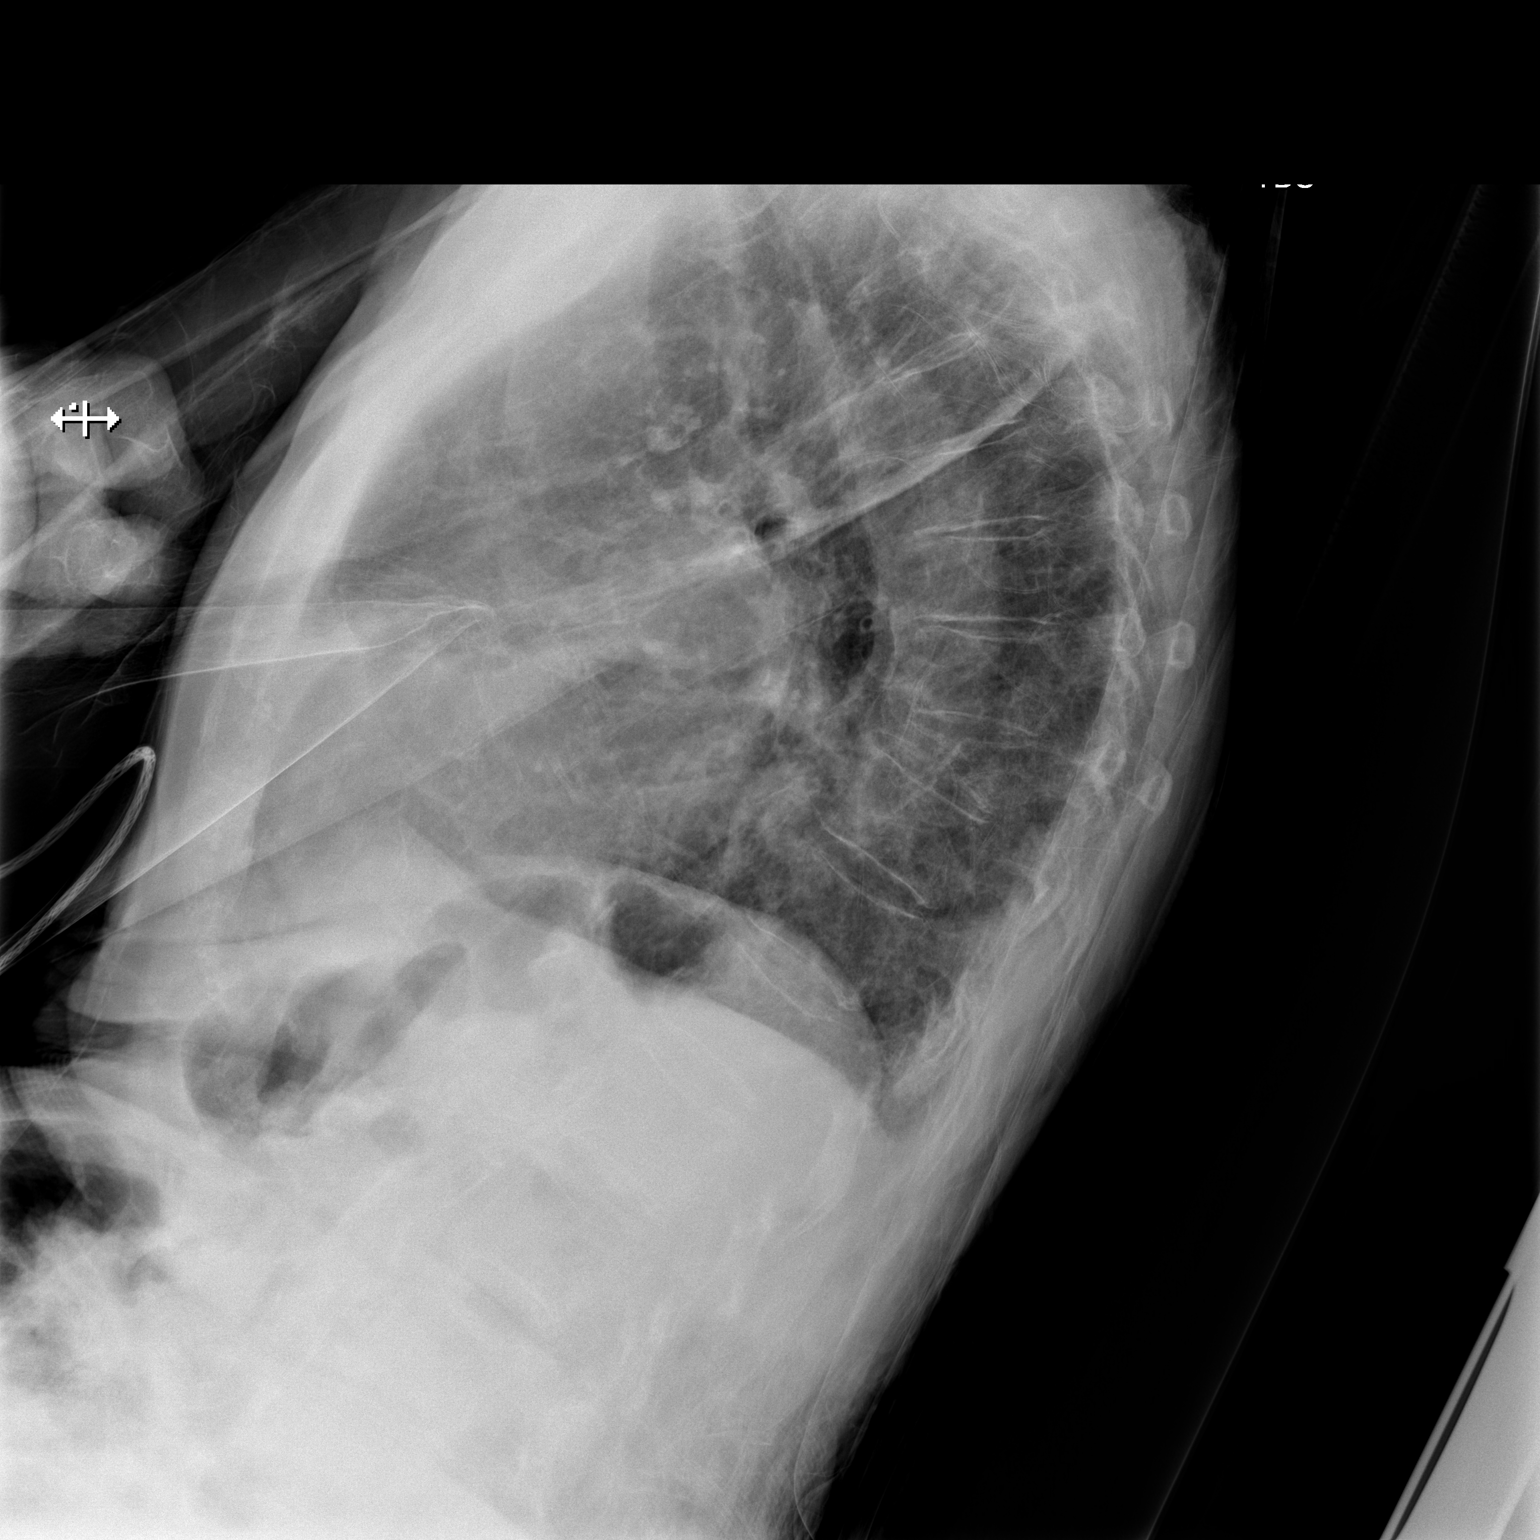

[2 of 2 positions shown; findings below may reference images not displayed]

FINDINGS: Chronic central bronchial wall thickening is noted with diffuse mild
prominence of the interstitial markings but no focal pulmonary
opacity. The aorta is unfolded and ectatic. Mild enlargement of the
cardiomediastinal silhouette is reidentified. Central thoracic
vertebral compression fracture deformity is stable. No pleural
effusion.
IMPRESSION: Stable chronic findings as above.
